# Patient Record
Sex: Male | Born: 1955 | Hispanic: No | Marital: Married | Smoking: Former smoker
Health system: Southern US, Community
[De-identification: ages and names within clinical notes are randomized; demographics above are authoritative.]

## PROBLEM LIST (undated history)

## (undated) DIAGNOSIS — M4317 Spondylolisthesis, lumbosacral region: Secondary | ICD-10-CM

## (undated) DIAGNOSIS — M5417 Radiculopathy, lumbosacral region: Secondary | ICD-10-CM

## (undated) DIAGNOSIS — G8929 Other chronic pain: Secondary | ICD-10-CM

## (undated) DIAGNOSIS — R519 Headache, unspecified: Secondary | ICD-10-CM

## (undated) DIAGNOSIS — R51 Headache: Secondary | ICD-10-CM

## (undated) DIAGNOSIS — I10 Essential (primary) hypertension: Secondary | ICD-10-CM

## (undated) HISTORY — PX: ABSCESS DRAINAGE: SHX1119

## (undated) HISTORY — DX: Other chronic pain: G89.29

---

## 1998-12-31 ENCOUNTER — Emergency Department (HOSPITAL_COMMUNITY): Admission: EM | Admit: 1998-12-31 | Discharge: 1998-12-31 | Payer: Self-pay | Admitting: Emergency Medicine

## 1998-12-31 ENCOUNTER — Encounter: Payer: Self-pay | Admitting: Emergency Medicine

## 2005-05-25 ENCOUNTER — Emergency Department (HOSPITAL_COMMUNITY): Admission: EM | Admit: 2005-05-25 | Discharge: 2005-05-25 | Payer: Self-pay | Admitting: Emergency Medicine

## 2005-05-29 ENCOUNTER — Other Ambulatory Visit: Admission: RE | Admit: 2005-05-29 | Discharge: 2005-05-29 | Payer: Self-pay | Admitting: Otolaryngology

## 2010-04-22 DIAGNOSIS — M5417 Radiculopathy, lumbosacral region: Secondary | ICD-10-CM

## 2010-04-22 HISTORY — DX: Radiculopathy, lumbosacral region: M54.17

## 2015-10-02 ENCOUNTER — Emergency Department (HOSPITAL_COMMUNITY)
Admission: EM | Admit: 2015-10-02 | Discharge: 2015-10-02 | Disposition: A | Discharge status: Home / Self Care | Payer: Self-pay | Attending: Emergency Medicine | Admitting: Emergency Medicine

## 2015-10-02 ENCOUNTER — Encounter (HOSPITAL_COMMUNITY): Payer: Self-pay | Admitting: Nurse Practitioner

## 2015-10-02 DIAGNOSIS — L918 Other hypertrophic disorders of the skin: Secondary | ICD-10-CM

## 2015-10-02 DIAGNOSIS — M545 Low back pain, unspecified: Secondary | ICD-10-CM

## 2015-10-02 DIAGNOSIS — Z87891 Personal history of nicotine dependence: Secondary | ICD-10-CM | POA: Insufficient documentation

## 2015-10-02 DIAGNOSIS — L723 Sebaceous cyst: Secondary | ICD-10-CM

## 2015-10-02 DIAGNOSIS — G8929 Other chronic pain: Secondary | ICD-10-CM | POA: Insufficient documentation

## 2015-10-02 DIAGNOSIS — I10 Essential (primary) hypertension: Secondary | ICD-10-CM | POA: Insufficient documentation

## 2015-10-02 HISTORY — DX: Essential (primary) hypertension: I10

## 2015-10-02 HISTORY — DX: Radiculopathy, lumbosacral region: M54.17

## 2015-10-02 MED ORDER — LIDOCAINE-EPINEPHRINE 1 %-1:100000 IJ SOLN
10.0000 mL | Freq: Once | INTRAMUSCULAR | Status: AC
  Administered 2015-10-02: 10 mL
  Filled 2015-10-02: qty 1

## 2015-10-02 MED ORDER — OXYCODONE-ACETAMINOPHEN 5-325 MG PO TABS: 1.0000 | ORAL_TABLET | Freq: Four times a day (QID) | ORAL | Status: DC | PRN

## 2015-10-02 MED ORDER — SULFAMETHOXAZOLE-TRIMETHOPRIM 800-160 MG PO TABS: 1.0000 | ORAL_TABLET | Freq: Two times a day (BID) | ORAL | Status: AC

## 2015-10-02 MED ORDER — MELOXICAM 15 MG PO TABS: 15.0000 mg | ORAL_TABLET | Freq: Every day | ORAL | Status: DC | PRN

## 2015-10-02 MED ORDER — SULFAMETHOXAZOLE-TRIMETHOPRIM 800-160 MG PO TABS
1.0000 | ORAL_TABLET | Freq: Once | ORAL | Status: AC
  Administered 2015-10-02: 1 via ORAL
  Filled 2015-10-02: qty 1

## 2015-10-02 NOTE — Progress Notes (Signed)
CM spoke with pt who confirms uninsured [place] resident with no pcp.  CM discussed and provided written information to assist pt with determining choice for uninsured accepting pcps, discussed the importance of pcp vs EDP services for f/u care, www.needymeds.org, www.goodrx.com, discounted pharmacies and other [place] resources such as [facility] , [facility], affordable care act, financial assistance, uninsured dental services, [facility], DSS and  health department  Reviewed resources for [place] uninsured accepting pcps like [facility], [facility] [address], [facility], [facility], local urgent care centers, [facility], [facility], general medical clinics, [facility], [facility] plus others, medication resources, [facility] out patient [facility]ies and housing Pt voiced understanding and appreciation of resources provided   Provided [facility] contact information 

## 2015-10-02 NOTE — ED Notes (Signed)
Patient presents to the [facility] today for complaints of a cyst on his neck that has grown larger within the past week.   He also complains of pain in his legs which he states is chronic and he receives disability for this condition. He has not gone to a doctor within the past year because he no longer has insurance. Previously he was taking gabapentin and tramadol which he said made him dizzy and did not help his pain. He also had epidural injections which only lasted 1-2 weeks. Surgery was mentioned to him but says his neurologist said he was not a good candidate. Patient is unsure why.   He was also prescribed lisinopril/hctz combo pill for blood pressure and has not taken that medication for approximately 1 year.   Patient also complains of some urinary urgency and low volume. He says he will have the urge to urinate and then very small amounts will come out. He is unsure of his prostate health. He does not think he has had a prostate exam and does not take any prostate medications. He denies other infection symptoms including fever, chills, or burning.

## 2015-10-02 NOTE — Discharge Instructions (Signed)
Back Pain, Adult Back pain is very common. The pain often gets better over time. The cause of back pain is usually not dangerous. Most people can learn to manage their back pain on their own.  HOME CARE  Watch your back pain for any changes. The following actions may help to lessen any pain you are feeling:  Stay active. Start with short walks on flat ground if you can. Try to walk farther each day.  Exercise regularly as told by your doctor. Exercise helps your back heal faster. It also helps avoid future injury by keeping your muscles strong and flexible.  Do not sit, drive, or stand in one place for more than 30 minutes.  Do not stay in bed. Resting more than 1-2 days can slow down your recovery.  Be careful when you bend or lift an object. Use good form when lifting:  Bend at your knees.  Keep the object close to your body.  Do not twist.  Sleep on a firm mattress. Lie on your side, and bend your knees. If you lie on your back, put a pillow under your knees.  Take medicines only as told by your doctor.  Put ice on the injured area.  Put ice in a plastic bag.  Place a towel between your skin and the bag.  Leave the ice on for 20 minutes, 2-3 times a day for the first 2-3 days. After that, you can switch between ice and heat packs.  Avoid feeling anxious or stressed. Find good ways to deal with stress, such as exercise.  Maintain a healthy weight. Extra weight puts stress on your back. GET HELP IF:   You have pain that does not go away with rest or medicine.  You have worsening pain that goes down into your legs or buttocks.  You have pain that does not get better in one week.  You have pain at night.  You lose weight.  You have a fever or chills. GET HELP RIGHT AWAY IF:   You cannot control when you poop (bowel movement) or pee (urinate).  Your arms or legs feel weak.  Your arms or legs lose feeling (numbness).  You feel sick to your stomach (nauseous) or  throw up (vomit).  You have belly (abdominal) pain.  You feel like you may pass out (faint).   This information is not intended to replace advice given to you by your health care provider. Make sure you discuss any questions you have with your health care provider.   Document Released: 09/25/2007 Document Revised: 04/29/2014 Document Reviewed: 08/10/2013 Elsevier Interactive Patient Education 2016 [employer].  Excision of Skin Lesions Excision of a skin lesion refers to the removal of a section of skin by making small cuts (incisions) in the skin. This procedure may be done to remove a cancerous (malignant) or noncancerous (benign) growth on the skin. It is typically done to treat or prevent cancer or infection. It may also be done to improve cosmetic appearance. The procedure may be done to remove:  Cancerous growths, such as basal cell carcinoma, squamous cell carcinoma, or melanoma.  Noncancerous growths, such as a cyst or lipoma.  Growths, such as moles or skin tags, which may be removed for cosmetic reasons. Various excision or surgical techniques may be used depending on your condition, the location of the lesion, and your overall health. LET [facility] CARE PROVIDER KNOW ABOUT:  Any allergies you have.  All medicines you are taking, including vitamins,  herbs, eye drops, creams, and over-the-counter medicines.  Previous problems you or members of your family have had with the use of anesthetics.  Any blood disorders you have.  Previous surgeries you have had.  Any medical conditions you have.  Whether you are pregnant or may be pregnant. RISKS AND COMPLICATIONS Generally, this is a safe procedure. However, problems may occur, including:  Bleeding.  Infection.  Scarring.  Recurrence of the cyst, lipoma, or cancer.  Changes in skin sensation or appearance, such as discoloration or swelling.  Reaction to the anesthetics.  Allergic reaction to surgical  materials or ointments.  Damage to nerves, blood vessels, muscles, or other structures.  Continued pain. BEFORE THE PROCEDURE  Ask your health care provider about:  Changing or stopping your regular medicines. This is especially important if you are taking diabetes medicines or blood thinners.  Taking medicines such as aspirin and ibuprofen. These medicines can thin your blood. Do not take these medicines before your procedure if your health care provider instructs you not to.  You may be asked to take certain medicines.  You may be asked to stop smoking.  You may have an exam or testing.  Plan to have someone take you home after the procedure.  Plan to have someone help you with activities during recovery. PROCEDURE  To reduce your risk of infection:  Your health care team will wash or sanitize their hands.  Your skin will be washed with soap.  You will be given a medicine to numb the area (local anesthetic).  One of the following excision techniques will be performed.  At the end of any of these procedures, antibiotic ointment will be applied as needed. Each of the following techniques may vary among health care providers and hospitals. Complete Surgical Excision The area of skin that needs to be removed will be marked with a pen. Using a small scalpel or scissors, the surgeon will gently cut around and under the lesion until it is completely removed. The lesion will be placed in a fluid and sent to the lab for examination. If necessary, bleeding will be controlled with a device that delivers heat (electrocautery). The edges of the wound may be stitched (sutured) together, and a bandage (dressing) will be applied. This procedure may be performed to treat a cancerous growth or a noncancerous cyst or lesion. Excision of a Cyst The surgeon will make an incision on the cyst. The entire cyst will be removed through the incision. The incision may be closed with sutures. Shave  Excision During shave excision, the surgeon will use a small blade or an electrically heated loop instrument to shave off the lesion. This may be done to remove a mole or a skin tag. The wound will usually be left to heal on its own without sutures. Punch Excision During punch excision, the surgeon will use a small tool that is like a cookie cutter or a hole punch to cut a circle shape out of the skin. The outer edges of the skin will be sutured together. This may be done to remove a mole or a scar or to perform a biopsy of the lesion. Mohs Micrographic Surgery During Mohs micrographic surgery, layers of the lesion will be removed with a scalpel or a loop instrument and will be examined right away under a microscope. Layers will be removed until all of the abnormal or cancerous tissue has been removed. This procedure is minimally invasive, and it ensures the best cosmetic outcome. It  involves the removal of as little normal tissue as possible. Mohs is usually done to treat skin cancer, such as basal cell carcinoma or squamous cell carcinoma, particularly on the face and ears. Depending on the size of the surgical wound, it may be sutured closed. AFTER THE PROCEDURE  Return to your normal activities as told by your health care provider.  Talk with your health care provider to discuss any test results, treatment options, and if necessary, the need for more tests.   This information is not intended to replace advice given to you by your health care provider. Make sure you discuss any questions you have with your health care provider.   Document Released: 07/03/2009 Document Revised: 12/28/2014 Document Reviewed: 05/25/2014 Elsevier Interactive Patient Education 2016 [place] [employer].  Epidermal Cyst An epidermal cyst is sometimes called a sebaceous cyst, epidermal inclusion cyst, or infundibular cyst. These cysts usually contain a substance that looks "pasty" or "cheesy" and may have a bad smell. This  substance is a protein called keratin. Epidermal cysts are usually found on the face, neck, or trunk. They may also occur in the vaginal area or other parts of the genitalia of both men and women. Epidermal cysts are usually small, painless, slow-growing bumps or lumps that move freely under the skin. It is important not to try to pop them. This may cause an infection and lead to tenderness and swelling. CAUSES  Epidermal cysts may be caused by a deep penetrating injury to the skin or a plugged hair follicle, often associated with acne. SYMPTOMS  Epidermal cysts can become inflamed and cause:  Redness.  Tenderness.  Increased temperature of the skin over the bumps or lumps.  Grayish-white, bad smelling material that drains from the bump or lump. DIAGNOSIS  Epidermal cysts are easily diagnosed by your caregiver during an exam. Rarely, a tissue sample (biopsy) may be taken to rule out other conditions that may resemble epidermal cysts. TREATMENT   Epidermal cysts often get better and disappear on their own. They are rarely ever cancerous.  If a cyst becomes infected, it may become inflamed and tender. This may require opening and draining the cyst. Treatment with antibiotics may be necessary. When the infection is gone, the cyst may be removed with minor surgery.  Small, inflamed cysts can often be treated with antibiotics or by injecting steroid medicines.  Sometimes, epidermal cysts become large and bothersome. If this happens, surgical removal in your caregiver's office may be necessary. HOME CARE INSTRUCTIONS  Only take over-the-counter or prescription medicines as directed by your caregiver.  Take your antibiotics as directed. Finish them even if you start to feel better. SEEK MEDICAL CARE IF:   Your cyst becomes tender, red, or swollen.  Your condition is not improving or is getting worse.  You have any other questions or concerns. MAKE SURE YOU:  Understand these  instructions.  Will watch your condition.  Will get help right away if you are not doing well or get worse.   This information is not intended to replace advice given to you by your health care provider. Make sure you discuss any questions you have with your health care provider.   Document Released: 03/09/2004 Document Revised: 07/01/2011 Document Reviewed: 10/15/2010 Elsevier Interactive Patient Education [employer].

## 2015-10-18 NOTE — ED Provider Notes (Signed)
CSN: [id number]     Arrival date & time 10/02/15  0913 History   First MD Initiated Contact with Patient 10/02/15 1027     Chief Complaint  Patient presents with  . Cyst     (Consider location/radiation/quality/duration/timing/severity/associated sxs/prior Treatment) HPI   60 year old male with lower back pain. This has been chronic for years. It interferes with his ability to ambulate into more than light activities. Is been worse for the last 2 days. Denies any acute trauma. No fevers or chills. No acute numbness, tingling or focal loss of strength. No urinary complaints. Denies use of blood thinning medication. He is also complaining of a sebaceous cyst to his right posterior/lateral neck. This has been present for years and has slowly been increasing in size. He also has several skin tags to his posterior neck and inquiring about possible removal.  Past Medical History  Diagnosis Date  . Radiculopathy, lumbosacral region 2012  . Hypertension    History reviewed. No pertinent past surgical history. History reviewed. No pertinent family history. Social History  Substance Use Topics  . Smoking status: Former Research scientist (life sciences)  . Smokeless tobacco: None  . Alcohol Use: No    Review of Systems  All systems reviewed and negative, other than as noted in HPI.   Allergies  Review of patient's allergies indicates no known allergies.  Home Medications   Prior to Admission medications   Medication Sig Start Date End Date Taking? Authorizing Provider  naproxen sodium (ANAPROX) 220 MG tablet Take 440 mg by mouth every 12 (twelve) hours as needed (for pain).   Yes Historical Provider, MD  meloxicam (MOBIC) 15 MG tablet Take 1 tablet (15 mg total) by mouth daily as needed for pain. 10/02/15   Virgel Manifold, MD  oxyCODONE-acetaminophen (PERCOCET/ROXICET) 5-325 MG tablet Take 1 tablet by mouth every 6 (six) hours as needed for severe pain. 10/02/15   Virgel Manifold, MD   BP 145/106 mmHg  Pulse 77   Temp(Src) 97.7 F (36.5 C) (Oral)  Resp 18  SpO2 99% Physical Exam  Constitutional: He appears well-developed and well-nourished. No distress.  HENT:  Head: Normocephalic and atraumatic.  Sebaceous cyst to right posterior lateral neck. Slightly larger than the size of a golf ball. Does not appear infected. Several small skin tags in the same general area.  Eyes: Conjunctivae are normal. Right eye exhibits no discharge. Left eye exhibits no discharge.  Neck: Neck supple.  Cardiovascular: Normal rate, regular rhythm and normal heart sounds.  Exam reveals no gallop and no friction rub.   No murmur heard. Pulmonary/Chest: Effort normal and breath sounds normal. No respiratory distress.  Abdominal: Soft. He exhibits no distension. There is no tenderness.  Musculoskeletal: He exhibits no edema or tenderness.  No midline spinal tenderness. Strength is 5 out of 5 bilateral lower extremities. Sensation is intact to light touch. Palpable DP pulses.  Neurological: He is alert.  Skin: Skin is warm and dry.  Psychiatric: He has a normal mood and affect. His behavior is normal. Thought content normal.  Nursing note and vitals reviewed.   ED Course  Procedures (including critical care time)   Epidermal cyst - minimal excision After obtaining informed consent, the patient's identity, procedure, and site were verified. The area was prepared with Chlorhexidine and draped.  % Xylocaine with Epinephrine local anaesthetic was used for anesthesia.  The cyst was incised with a #11 blade and the contents evacuated with pressure.  Hemostasis achieved with pressure without difficulty. Bacitracin and bandaid  applied.  Tolerated well. Negligible blood loss.  Routine instructions were given to the patient to return if any signs of infection.  Skin tag - shave excision Skin around skin tag was prepped with chlorhexidine, draped with sterile towels, infiltrated w/1% lidocaine with epinephrine ml to raise a  wheal, and shave excised.  Hemostasis achieved with direct pressure. Bacitracin and bandaid applied.  Tolerated well. Negligible blood loss. Skin tag discarded.  Labs Review Labs Reviewed - No data to display  Imaging Review No results found. I have personally reviewed and evaluated these images and lab results as part of my medical decision-making.   EKG Interpretation None      MDM   Final diagnoses:  Chronic lower back pain  Sebaceous cyst  Cutaneous skin tags    60 year old male with multiple complaints. His chief complaint is lower back pain which is chronic in nature. No concerning "red flags." Plan symptomatic treatment. He is additionally complaining of epidermal cyst to his right neck and systems contacts which were removed. Continued wound care return precautions were discussed. Outpatient follow-up otherwise.    Virgel Manifold, MD 10/18/15 [phone]

## 2015-10-24 ENCOUNTER — Emergency Department (HOSPITAL_COMMUNITY)
Admission: EM | Admit: 2015-10-24 | Discharge: 2015-10-24 | Disposition: A | Discharge status: Home / Self Care | Payer: Medicaid Other | Attending: Emergency Medicine | Admitting: Emergency Medicine

## 2015-10-24 ENCOUNTER — Encounter (HOSPITAL_COMMUNITY): Payer: Self-pay | Admitting: Emergency Medicine

## 2015-10-24 DIAGNOSIS — L02414 Cutaneous abscess of left upper limb: Secondary | ICD-10-CM | POA: Insufficient documentation

## 2015-10-24 DIAGNOSIS — I10 Essential (primary) hypertension: Secondary | ICD-10-CM | POA: Insufficient documentation

## 2015-10-24 DIAGNOSIS — Z87891 Personal history of nicotine dependence: Secondary | ICD-10-CM | POA: Insufficient documentation

## 2015-10-24 DIAGNOSIS — L0291 Cutaneous abscess, unspecified: Secondary | ICD-10-CM

## 2015-10-24 MED ORDER — LIDOCAINE-EPINEPHRINE 2 %-1:100000 IJ SOLN
10.0000 mL | Freq: Once | INTRAMUSCULAR | Status: AC
  Administered 2015-10-24: 10 mL
  Filled 2015-10-24: qty 1

## 2015-10-24 MED ORDER — CEPHALEXIN 500 MG PO CAPS: 500.0000 mg | ORAL_CAPSULE | Freq: Four times a day (QID) | ORAL | Status: DC

## 2015-10-24 NOTE — Discharge Instructions (Signed)
Abscess °An abscess is an infected area that contains a collection of pus and debris. It can occur in almost any part of the body. An abscess is also known as a furuncle or boil. °CAUSES  °An abscess occurs when tissue gets infected. This can occur from blockage of oil or sweat glands, infection of hair follicles, or a minor injury to the skin. As the body tries to fight the infection, pus collects in the area and creates pressure under the skin. This pressure causes pain. People with weakened immune systems have difficulty fighting infections and get certain abscesses more often.  °SYMPTOMS °Usually an abscess develops on the skin and becomes a painful mass that is red, warm, and tender. If the abscess forms under the skin, you may feel a moveable soft area under the skin. Some abscesses break open (rupture) on their own, but most will continue to get worse without care. The infection can spread deeper into the body and eventually into the bloodstream, causing you to feel ill.  °DIAGNOSIS  °Your caregiver will take your medical history and perform a physical exam. A sample of fluid may also be taken from the abscess to determine what is causing your infection. °TREATMENT  °Your caregiver may prescribe antibiotic medicines to fight the infection. However, taking antibiotics alone usually does not cure an abscess. Your caregiver may need to make a small cut (incision) in the abscess to drain the pus. In some cases, gauze is packed into the abscess to reduce pain and to continue draining the area. °HOME CARE INSTRUCTIONS  °· Only take over-the-counter or prescription medicines for pain, discomfort, or fever as directed by your caregiver. °· If you were prescribed antibiotics, take them as directed. Finish them even if you start to feel better. °· If gauze is used, follow your caregiver's directions for changing the gauze. °· To avoid spreading the infection: °· Keep your draining abscess covered with a  bandage. °· Wash your hands well. °· Do not share personal care items, towels, or whirlpools with others. °· Avoid skin contact with others. °· Keep your skin and clothes clean around the abscess. °· Keep all follow-up appointments as directed by your caregiver. °SEEK MEDICAL CARE IF:  °· You have increased pain, swelling, redness, fluid drainage, or bleeding. °· You have muscle aches, chills, or a general ill feeling. °· You have a fever. °MAKE SURE YOU:  °· Understand these instructions. °· Will watch your condition. °· Will get help right away if you are not doing well or get worse. °  °This information is not intended to replace advice given to you by your health care provider. Make sure you discuss any questions you have with your health care provider. °  °Document Released: 01/16/2005 Document Revised: 10/08/2011 Document Reviewed: 06/21/2011 °Elsevier Interactive Patient Education ©2016 Elsevier Inc. ° °Incision and Drainage °Incision and drainage is a procedure in which a sac-like structure (cystic structure) is opened and drained. The area to be drained usually contains material such as pus, fluid, or blood.  °LET YOUR CAREGIVER KNOW ABOUT:  °· Allergies to medicine. °· Medicines taken, including vitamins, herbs, eyedrops, over-the-counter medicines, and creams. °· Use of steroids (by mouth or creams). °· Previous problems with anesthetics or numbing medicines. °· History of bleeding problems or blood clots. °· Previous surgery. °· Other health problems, including diabetes and kidney problems. °· Possibility of pregnancy, if this applies. °RISKS AND COMPLICATIONS °· Pain. °· Bleeding. °· Scarring. °· Infection. °BEFORE THE PROCEDURE  °  You may need to have an ultrasound or other imaging tests to see how large or deep your cystic structure is. Blood tests may also be used to determine if you have an infection or how severe the infection is. You may need to have a tetanus shot. PROCEDURE  The affected area  is cleaned with a cleaning fluid. The cyst area will then be numbed with a medicine (local anesthetic). A small incision will be made in the cystic structure. A syringe or catheter may be used to drain the contents of the cystic structure, or the contents may be squeezed out. The area will then be flushed with a cleansing solution. After cleansing the area, it is often gently packed with a gauze or another wound dressing. Once it is packed, it will be covered with gauze and tape or some other type of wound dressing. AFTER THE PROCEDURE   Often, you will be allowed to go home right after the procedure.  You may be given antibiotic medicine to prevent or heal an infection.  If the area was packed with gauze or some other wound dressing, you will likely need to come back in 1 to 2 days to get it removed.  The area should heal in about 14 days.   This information is not intended to replace advice given to you by your health care provider. Make sure you discuss any questions you have with your health care provider.  Remove approximately 1/2inch of packing daily until all packing is removed. Take antibiotics as prescribed. Your cyst may return, unfortunately. If this happens please follow up with [facility] for further evaluation. Return to the ED if you experience redness or swelling around your wound, fevers, chills.

## 2015-10-24 NOTE — ED Notes (Addendum)
Patient reports abscess to left shoulder x2 weeks. States he tried to bust the abscess yesterday and fluid came out. Denies fever and pain. Reports itchiness to area.

## 2015-10-24 NOTE — ED Notes (Signed)
Discharge instructions, follow up care, and rx x1 reviewed with patient. Patient verbalized understanding. 

## 2015-10-24 NOTE — ED Provider Notes (Signed)
CSN: [id number]     Arrival date & time 10/24/15  [id number] History   First MD Initiated Contact with Patient 10/24/15 1020     Chief Complaint  Patient presents with  . Abscess     (Consider location/radiation/quality/duration/timing/severity/associated sxs/prior Treatment) HPI   Steven Ross is a 60 year old male with past medical history of HTN, back pain who presents the ED today complaining of a cyst to his left shoulder. Patient states that this is isn't present for greater than 10 years. He was seen in the ED approximately one month ago and had the cyst excised. He was given antibiotics which she has taken all of them. Patient states that his symptoms had improved for a while. However, yesterday the cyst got larger and began draining clear/brown fluid that has a foul odor to it. He states that the wound has not healed completely. He states that it is not painful at all. He has been pressing on it to express the fluids. He denies any fevers, chills, redness around the wound.  Past Medical History  Diagnosis Date  . Radiculopathy, lumbosacral region 2012  . Hypertension    History reviewed. No pertinent past surgical history. No family history on file. Social History  Substance Use Topics  . Smoking status: Former Research scientist (life sciences)  . Smokeless tobacco: None  . Alcohol Use: No    Review of Systems  All other systems reviewed and are negative.     Allergies  Review of patient's allergies indicates no known allergies.  Home Medications   Prior to Admission medications   Medication Sig Start Date End Date Taking? Authorizing Provider  meloxicam (MOBIC) 15 MG tablet Take 1 tablet (15 mg total) by mouth daily as needed for pain. 10/02/15   Virgel Manifold, MD  naproxen sodium (ANAPROX) 220 MG tablet Take 440 mg by mouth every 12 (twelve) hours as needed (for pain).    Historical Provider, MD  oxyCODONE-acetaminophen (PERCOCET/ROXICET) 5-325 MG tablet Take 1 tablet by mouth every 6 (six) hours  as needed for severe pain. 10/02/15   Virgel Manifold, MD   BP 156/98 mmHg  Pulse 89  Temp(Src) 97.8 F (36.6 C) (Oral)  Resp 16  Ht 5\' 4"  (1.626 m)  Wt 85.73 kg  BMI 32.43 kg/m2  SpO2 99% Physical Exam  Constitutional: He is oriented to person, place, and time. He appears well-developed and well-nourished. No distress.  HENT:  Head: Normocephalic and atraumatic.  Eyes: Conjunctivae are normal. Right eye exhibits no discharge. Left eye exhibits no discharge. No scleral icterus.  Neck: Neck supple.  Cardiovascular: Normal rate.   Pulmonary/Chest: Effort normal.  Neurological: He is alert and oriented to person, place, and time. Coordination normal.  Skin: Skin is warm and dry. No rash noted. He is not diaphoretic. No erythema. No pallor.     1cmx2cm sebaceous cyst over left shoulder. Overlying erythema presents. No induration or streaking. NonTTP.   Psychiatric: He has a normal mood and affect. His behavior is normal.  Nursing note and vitals reviewed.   ED Course  Procedures (including critical care time)  INCISION AND DRAINAGE Performed by: Steven Ross Consent: Verbal consent obtained. Risks and benefits: risks, benefits and alternatives were discussed Type: abscess  Body area: left shoulder  Anesthesia: local infiltration  Incision was made with a scalpel.  Local anesthetic: lidocaine 1% without epinephrine  Anesthetic total: 5 ml  Complexity: complex Blunt dissection to break up loculations  Drainage: purulent  Drainage amount: copious  Packing material:  1/4 in iodoform gauze  Patient tolerance: Patient tolerated the procedure well with no immediate complications.    Labs Review Labs Reviewed - No data to display  Imaging Review No results found. I have personally reviewed and evaluated these images and lab results as part of my medical decision-making.   EKG Interpretation None      MDM   Final diagnoses:  Abscess    Patient  with sebaceous cyst of skin with overlying erythema. Previous incision mark presnt from previous I&D. Small amount of serosanguinous fluid draining out. Abscess amenable to incision and drainage. Large amount of cyst contents removed. Wound was packed and recommend recheck in 2 days. Encouraged home warm soaks and flushing.  Mild signs of cellulitis is surrounding skin.  Will d/c to home. Pt given Rx for keflex given this cyst has returned despite previous I & D less than one month ago. Will also give referral to [place] surgery if cyst return again.    Dondra Spry Kimball, PA-C 10/24/15 1246  Gareth Morgan, MD 10/24/15 2300

## 2016-11-28 DIAGNOSIS — M4727 Other spondylosis with radiculopathy, lumbosacral region: Secondary | ICD-10-CM | POA: Diagnosis not present

## 2016-11-28 DIAGNOSIS — I10 Essential (primary) hypertension: Secondary | ICD-10-CM | POA: Diagnosis not present

## 2016-11-28 DIAGNOSIS — M5416 Radiculopathy, lumbar region: Secondary | ICD-10-CM | POA: Diagnosis not present

## 2016-11-28 DIAGNOSIS — Z6833 Body mass index (BMI) 33.0-33.9, adult: Secondary | ICD-10-CM | POA: Diagnosis not present

## 2016-12-04 ENCOUNTER — Other Ambulatory Visit: Payer: Self-pay | Admitting: Neurological Surgery

## 2016-12-04 DIAGNOSIS — M545 Low back pain: Secondary | ICD-10-CM | POA: Diagnosis not present

## 2016-12-04 DIAGNOSIS — M4727 Other spondylosis with radiculopathy, lumbosacral region: Secondary | ICD-10-CM | POA: Diagnosis not present

## 2016-12-04 DIAGNOSIS — M4317 Spondylolisthesis, lumbosacral region: Secondary | ICD-10-CM | POA: Diagnosis not present

## 2016-12-04 DIAGNOSIS — Z6834 Body mass index (BMI) 34.0-34.9, adult: Secondary | ICD-10-CM | POA: Diagnosis not present

## 2016-12-04 DIAGNOSIS — M5416 Radiculopathy, lumbar region: Secondary | ICD-10-CM | POA: Diagnosis not present

## 2016-12-05 ENCOUNTER — Other Ambulatory Visit (HOSPITAL_COMMUNITY): Payer: Self-pay | Admitting: Neurological Surgery

## 2016-12-05 DIAGNOSIS — M4726 Other spondylosis with radiculopathy, lumbar region: Secondary | ICD-10-CM

## 2016-12-11 NOTE — Congregational Nurse Program (Signed)
Congregational Nurse Program Note  Date of Encounter: 12/11/2016  Past Medical History: Past Medical History:  Diagnosis Date  . Hypertension   . Radiculopathy, lumbosacral region 2012    Encounter Details: Patient came to CN office asking to make an appointment with a PCP.  He sees Dr. Cyndy Freeze at [facility] and is scheduled for back surgery on 12/27/2016.  States he is on Lisinopril and Gabapentin per Dr. Cyndy Freeze but does not have a PCP.  BP today 140/90 and says he took his medicine this am.  Monitors BP at home and it was 140/84 this morning.  States he has [employer].  CN called [facility] to schedule new patient appointment.  Staff member there checked Medicaid eligibility and patient has Family Planning Only Medicaid.  Patient receives  disability income but denies having Medicare.  He was referred to the [employer] and learned that he does have Medicare Part A and Part B.  Phone call later to CN stating he found his Medicare Card at home. CN will call Internal Medicine clinic back tomorrow to make new patient appointment.  Jake Michaelis RN, Congregational Nurse [phone]     CNP Questionnaire - 12/11/16 1937      Patient Demographics   Is this a new or existing patient? New   Patient is considered a/an Refugee   Race Asian     Patient Assistance   Location of Patient Assistance Not Applicable   Patient's financial/insurance status Medicare   Uninsured Patient ([facility]) No   Patient referred to apply for the following financial assistance Not Applicable   Food insecurities addressed Not Applicable   Transportation assistance No   Assistance securing medications No   Educational health offerings Hypertension;Navigating the healthcare system     Encounter Details   Primary purpose of visit Navigating the Healthcare System;Chronic Illness/Condition Visit   Was an Emergency Department visit averted? Not  Applicable   Does patient have a medical provider? No   Patient referred to Establish PCP   Was a mental health screening completed? (GAINS tool) No   Does patient have dental issues? No   Does patient have vision issues? No   Does your patient have an abnormal blood pressure today? Yes   Since previous encounter, have you referred patient for abnormal blood pressure that resulted in a new diagnosis or medication change? No   Does your patient have an abnormal blood glucose today? No   Since previous encounter, have you referred patient for abnormal blood glucose that resulted in a new diagnosis or medication change? No   Was there a life-saving intervention made? No

## 2016-12-20 ENCOUNTER — Ambulatory Visit (HOSPITAL_COMMUNITY)
Admission: RE | Admit: 2016-12-20 | Discharge: 2016-12-20 | Disposition: A | Discharge status: Home / Self Care | Payer: Medicare Other | Source: Ambulatory Visit | Attending: Neurological Surgery | Admitting: Neurological Surgery

## 2016-12-20 DIAGNOSIS — M4316 Spondylolisthesis, lumbar region: Secondary | ICD-10-CM | POA: Insufficient documentation

## 2016-12-20 DIAGNOSIS — M4726 Other spondylosis with radiculopathy, lumbar region: Secondary | ICD-10-CM | POA: Insufficient documentation

## 2016-12-20 DIAGNOSIS — M47897 Other spondylosis, lumbosacral region: Secondary | ICD-10-CM | POA: Insufficient documentation

## 2016-12-20 DIAGNOSIS — M47896 Other spondylosis, lumbar region: Secondary | ICD-10-CM | POA: Insufficient documentation

## 2016-12-20 DIAGNOSIS — M48061 Spinal stenosis, lumbar region without neurogenic claudication: Secondary | ICD-10-CM | POA: Insufficient documentation

## 2016-12-20 DIAGNOSIS — M4807 Spinal stenosis, lumbosacral region: Secondary | ICD-10-CM | POA: Diagnosis not present

## 2016-12-26 ENCOUNTER — Encounter (HOSPITAL_COMMUNITY): Payer: Self-pay

## 2016-12-26 ENCOUNTER — Encounter (HOSPITAL_COMMUNITY): Payer: Self-pay | Admitting: Certified Registered Nurse Anesthetist

## 2016-12-26 ENCOUNTER — Encounter (HOSPITAL_COMMUNITY)
Admission: RE | Admit: 2016-12-26 | Discharge: 2016-12-26 | Disposition: A | Discharge status: Home / Self Care | Payer: Medicare Other | Source: Ambulatory Visit | Attending: Neurological Surgery | Admitting: Neurological Surgery

## 2016-12-26 DIAGNOSIS — M5417 Radiculopathy, lumbosacral region: Secondary | ICD-10-CM | POA: Diagnosis not present

## 2016-12-26 DIAGNOSIS — E669 Obesity, unspecified: Secondary | ICD-10-CM | POA: Diagnosis not present

## 2016-12-26 DIAGNOSIS — Z87891 Personal history of nicotine dependence: Secondary | ICD-10-CM | POA: Diagnosis not present

## 2016-12-26 DIAGNOSIS — I10 Essential (primary) hypertension: Secondary | ICD-10-CM | POA: Diagnosis not present

## 2016-12-26 DIAGNOSIS — M4317 Spondylolisthesis, lumbosacral region: Secondary | ICD-10-CM | POA: Diagnosis not present

## 2016-12-26 DIAGNOSIS — Z6833 Body mass index (BMI) 33.0-33.9, adult: Secondary | ICD-10-CM | POA: Diagnosis not present

## 2016-12-26 HISTORY — DX: Headache: R51

## 2016-12-26 HISTORY — DX: Headache, unspecified: R51.9

## 2016-12-26 HISTORY — DX: Spondylolisthesis, lumbosacral region: M43.17

## 2016-12-26 LAB — BASIC METABOLIC PANEL
ANION GAP: 5 (ref 5–15)
BUN: 16 mg/dL (ref 6–20)
CHLORIDE: 107 mmol/L (ref 101–111)
CO2: 28 mmol/L (ref 22–32)
CREATININE: 0.75 mg/dL (ref 0.61–1.24)
Calcium: 9 mg/dL (ref 8.9–10.3)
GFR calc non Af Amer: >60 mL/min (ref >60)
Glucose, Bld: 92 mg/dL (ref 65–99)
POTASSIUM: 4 mmol/L (ref 3.5–5.1)
SODIUM: 140 mmol/L (ref 135–145)

## 2016-12-26 LAB — CBC
HCT: 44.7 % (ref 39.0–52.0)
Hemoglobin: 14.8 g/dL (ref 13.0–17.0)
MCH: 29.7 pg (ref 26.0–34.0)
MCHC: 33.1 g/dL (ref 30.0–36.0)
MCV: 89.8 fL (ref 78.0–100.0)
PLATELETS: 182 10*3/uL (ref 150–400)
RBC: 4.98 MIL/uL (ref 4.22–5.81)
RDW: 13.6 % (ref 11.5–15.5)
WBC: 7.8 10*3/uL (ref 4.0–10.5)

## 2016-12-26 LAB — SURGICAL PCR SCREEN
MRSA, PCR: NEGATIVE
Staphylococcus aureus: NEGATIVE

## 2016-12-26 LAB — TYPE AND SCREEN
ABO/RH(D): A POS
Antibody Screen: NEGATIVE

## 2016-12-26 LAB — ABO/RH: ABO/RH(D): A POS

## 2016-12-26 NOTE — Progress Notes (Signed)
Pt denies SOB, chest pain, and being under the care of a cardiologist. Pt denies having a stress test, echo and cardiac cath. Pt denies having an EKG and chest x ray within the last year. Pt denies having recent labs.

## 2016-12-26 NOTE — Pre-Procedure Instructions (Signed)
    Steven Ross  12/26/2016      [id number] [address], [place] - [place] [address] Phone: [phone] Fax: [phone]    Your procedure is scheduled on Friday, December 27, 2016  Report to [facility] Admitting at 5:30 A.M.  Call this number if you have problems the morning of surgery:  [phone]   Remember:  Do not eat food or drink liquids after midnight.  Take these medicines the morning of surgery with A SIP OF WATER:gabapentin (NEURONTIN) Stop taking Aspirin, vitamins, fish oil and herbal medications. Do not take any NSAIDs ie: Ibuprofen, Advil, Naproxen (Anaprox),meloxicam (MOBIC), diclofenac (VOLTAREN) ,  Motrin, BC and Goody Powder  or any medication containing Aspirin;stop now.  Do not wear jewelry, make-up or nail polish.  Do not wear lotions, powders, or perfumes, or deoderant.  Do not shave 48 hours prior to surgery.  Men may shave face and neck.  Do not bring valuables to the hospital.  [facility] is not responsible for any belongings or valuables.  Contacts, dentures or bridgework may not be worn into surgery.  Leave your suitcase in the car.  After surgery it may be brought to your room. For patients admitted to the hospital, discharge time will be determined by your treatment team. Special instructions: Shower the night before surgery and the morning of surgery with CHG. Please read over the following fact sheets that you were given. Pain Booklet, Coughing and Deep Breathing, Blood Transfusion Information, MRSA Information and Surgical Site Infection Prevention

## 2016-12-27 ENCOUNTER — Inpatient Hospital Stay (HOSPITAL_COMMUNITY): Payer: Medicare Other

## 2016-12-27 ENCOUNTER — Encounter (HOSPITAL_COMMUNITY): Payer: Self-pay | Admitting: Urology

## 2016-12-27 ENCOUNTER — Inpatient Hospital Stay (HOSPITAL_COMMUNITY)
Admission: RE | Disposition: A | Discharge status: Home / Self Care | Payer: Self-pay | Source: Ambulatory Visit | Attending: Neurological Surgery

## 2016-12-27 ENCOUNTER — Inpatient Hospital Stay (HOSPITAL_COMMUNITY)
Admission: RE | Admit: 2016-12-27 | Discharge: 2016-12-30 | DRG: 460 | Disposition: A | Discharge status: Home / Self Care | Payer: Medicare Other | Source: Ambulatory Visit | Attending: Neurological Surgery | Admitting: Neurological Surgery

## 2016-12-27 ENCOUNTER — Inpatient Hospital Stay (HOSPITAL_COMMUNITY): Payer: Medicare Other | Admitting: Certified Registered Nurse Anesthetist

## 2016-12-27 DIAGNOSIS — M549 Dorsalgia, unspecified: Secondary | ICD-10-CM | POA: Diagnosis not present

## 2016-12-27 DIAGNOSIS — M5417 Radiculopathy, lumbosacral region: Secondary | ICD-10-CM | POA: Diagnosis present

## 2016-12-27 DIAGNOSIS — E669 Obesity, unspecified: Secondary | ICD-10-CM | POA: Diagnosis not present

## 2016-12-27 DIAGNOSIS — M4317 Spondylolisthesis, lumbosacral region: Secondary | ICD-10-CM | POA: Diagnosis present

## 2016-12-27 DIAGNOSIS — Z6833 Body mass index (BMI) 33.0-33.9, adult: Secondary | ICD-10-CM | POA: Diagnosis not present

## 2016-12-27 DIAGNOSIS — I10 Essential (primary) hypertension: Secondary | ICD-10-CM | POA: Diagnosis not present

## 2016-12-27 DIAGNOSIS — Z87891 Personal history of nicotine dependence: Secondary | ICD-10-CM

## 2016-12-27 DIAGNOSIS — Z419 Encounter for procedure for purposes other than remedying health state, unspecified: Secondary | ICD-10-CM

## 2016-12-27 DIAGNOSIS — M4326 Fusion of spine, lumbar region: Secondary | ICD-10-CM | POA: Diagnosis not present

## 2016-12-27 DIAGNOSIS — M4316 Spondylolisthesis, lumbar region: Secondary | ICD-10-CM | POA: Diagnosis not present

## 2016-12-27 DIAGNOSIS — M4727 Other spondylosis with radiculopathy, lumbosacral region: Secondary | ICD-10-CM | POA: Diagnosis not present

## 2016-12-27 HISTORY — PX: ANTERIOR LAT LUMBAR FUSION: SHX1168

## 2016-12-27 HISTORY — PX: APPLICATION OF ROBOTIC ASSISTANCE FOR SPINAL PROCEDURE: SHX6753

## 2016-12-27 SURGERY — ANTERIOR LATERAL LUMBAR FUSION 1 LEVEL
Anesthesia: General | Site: Back

## 2016-12-27 MED ORDER — SODIUM CHLORIDE 0.9 % IJ SOLN
INTRAMUSCULAR | Status: DC | PRN
  Administered 2016-12-27 (×2): 10 mL

## 2016-12-27 MED ORDER — THROMBIN 20000 UNITS EX SOLR
CUTANEOUS | Status: DC | PRN
  Administered 2016-12-27: 20 mL via TOPICAL

## 2016-12-27 MED ORDER — ROCURONIUM BROMIDE 100 MG/10ML IV SOLN
INTRAVENOUS | Status: DC | PRN
  Administered 2016-12-27: 40 mg via INTRAVENOUS
  Administered 2016-12-27: 10 mg via INTRAVENOUS
  Administered 2016-12-27: 40 mg via INTRAVENOUS

## 2016-12-27 MED ORDER — FENTANYL CITRATE (PF) 250 MCG/5ML IJ SOLN
INTRAMUSCULAR | Status: AC
  Filled 2016-12-27: qty 5

## 2016-12-27 MED ORDER — ZOLPIDEM TARTRATE 5 MG PO TABS: 5.0000 mg | ORAL_TABLET | Freq: Every evening | ORAL | Status: DC | PRN

## 2016-12-27 MED ORDER — ALBUMIN HUMAN 5 % IV SOLN
INTRAVENOUS | Status: AC
  Filled 2016-12-27: qty 250

## 2016-12-27 MED ORDER — BUPIVACAINE LIPOSOME 1.3 % IJ SUSP
20.0000 mL | INTRAMUSCULAR | Status: DC
  Filled 2016-12-27: qty 20

## 2016-12-27 MED ORDER — SODIUM CHLORIDE 0.9 % IV SOLN
INTRAVENOUS | Status: DC
  Administered 2016-12-27 – 2016-12-28 (×2): via INTRAVENOUS

## 2016-12-27 MED ORDER — MENTHOL 3 MG MT LOZG: 1.0000 | LOZENGE | OROMUCOSAL | Status: DC | PRN

## 2016-12-27 MED ORDER — MIDAZOLAM HCL 5 MG/5ML IJ SOLN
INTRAMUSCULAR | Status: DC | PRN
  Administered 2016-12-27: 2 mg via INTRAVENOUS
  Administered 2016-12-27: 1 mg via INTRAVENOUS

## 2016-12-27 MED ORDER — CHLORHEXIDINE GLUCONATE CLOTH 2 % EX PADS: 6.0000 | MEDICATED_PAD | Freq: Once | CUTANEOUS | Status: DC

## 2016-12-27 MED ORDER — OXYCODONE HCL 5 MG PO TABS
5.0000 mg | ORAL_TABLET | ORAL | Status: DC | PRN
  Administered 2016-12-27: 10 mg via ORAL
  Filled 2016-12-27: qty 2

## 2016-12-27 MED ORDER — BUPIVACAINE-EPINEPHRINE (PF) 0.5% -1:200000 IJ SOLN
INTRAMUSCULAR | Status: AC
  Filled 2016-12-27: qty 30

## 2016-12-27 MED ORDER — BUPIVACAINE-EPINEPHRINE (PF) 0.5% -1:200000 IJ SOLN
INTRAMUSCULAR | Status: DC | PRN
  Administered 2016-12-27: 10 mL
  Administered 2016-12-27: 5 mL via PERINEURAL

## 2016-12-27 MED ORDER — FLEET ENEMA 7-19 GM/118ML RE ENEM: 1.0000 | ENEMA | Freq: Once | RECTAL | Status: DC | PRN

## 2016-12-27 MED ORDER — ONDANSETRON HCL 4 MG PO TABS: 4.0000 mg | ORAL_TABLET | Freq: Four times a day (QID) | ORAL | Status: DC | PRN

## 2016-12-27 MED ORDER — DEXAMETHASONE SODIUM PHOSPHATE 4 MG/ML IJ SOLN
INTRAMUSCULAR | Status: DC | PRN
  Administered 2016-12-27: 10 mg via INTRAVENOUS

## 2016-12-27 MED ORDER — SODIUM CHLORIDE 0.9 % IR SOLN
Status: DC | PRN
  Administered 2016-12-27: 500 mL

## 2016-12-27 MED ORDER — PHENYLEPHRINE HCL 10 MG/ML IJ SOLN
INTRAMUSCULAR | Status: DC | PRN
  Administered 2016-12-27: 80 ug via INTRAVENOUS

## 2016-12-27 MED ORDER — ACETAMINOPHEN 10 MG/ML IV SOLN
INTRAVENOUS | Status: DC | PRN
  Administered 2016-12-27: 1000 mg via INTRAVENOUS

## 2016-12-27 MED ORDER — THROMBIN 5000 UNITS EX SOLR
CUTANEOUS | Status: AC
  Filled 2016-12-27: qty 5000

## 2016-12-27 MED ORDER — SUGAMMADEX SODIUM 200 MG/2ML IV SOLN
INTRAVENOUS | Status: DC | PRN
  Administered 2016-12-27: 200 mg via INTRAVENOUS

## 2016-12-27 MED ORDER — PROPOFOL 10 MG/ML IV BOLUS
INTRAVENOUS | Status: DC | PRN
  Administered 2016-12-27: 150 mg via INTRAVENOUS

## 2016-12-27 MED ORDER — CEFAZOLIN SODIUM-DEXTROSE 1-4 GM/50ML-% IV SOLN
1.0000 g | Freq: Three times a day (TID) | INTRAVENOUS | Status: AC
  Administered 2016-12-27 – 2016-12-28 (×2): 1 g via INTRAVENOUS
  Filled 2016-12-27 (×2): qty 50

## 2016-12-27 MED ORDER — BUPIVACAINE LIPOSOME 1.3 % IJ SUSP
INTRAMUSCULAR | Status: DC | PRN
  Administered 2016-12-27: 20 mL

## 2016-12-27 MED ORDER — LIDOCAINE HCL (CARDIAC) 20 MG/ML IV SOLN
INTRAVENOUS | Status: DC | PRN
  Administered 2016-12-27: 60 mg via INTRAVENOUS

## 2016-12-27 MED ORDER — SODIUM CHLORIDE 0.9 % IV SOLN: 250.0000 mL | INTRAVENOUS | Status: DC

## 2016-12-27 MED ORDER — THROMBIN 5000 UNITS EX SOLR
OROMUCOSAL | Status: DC | PRN
  Administered 2016-12-27 (×3): 5 mL via TOPICAL

## 2016-12-27 MED ORDER — VANCOMYCIN HCL 1000 MG IV SOLR
INTRAVENOUS | Status: DC | PRN
  Administered 2016-12-27: 1000 mg

## 2016-12-27 MED ORDER — CEFAZOLIN SODIUM-DEXTROSE 2-4 GM/100ML-% IV SOLN
2.0000 g | INTRAVENOUS | Status: AC
  Administered 2016-12-27 (×2): 2 g via INTRAVENOUS
  Filled 2016-12-27: qty 100

## 2016-12-27 MED ORDER — DIAZEPAM 5 MG PO TABS: 5.0000 mg | ORAL_TABLET | Freq: Four times a day (QID) | ORAL | Status: DC | PRN

## 2016-12-27 MED ORDER — SENNA 8.6 MG PO TABS
1.0000 | ORAL_TABLET | Freq: Two times a day (BID) | ORAL | Status: DC
  Administered 2016-12-27 – 2016-12-30 (×6): 8.6 mg via ORAL
  Filled 2016-12-27 (×6): qty 1

## 2016-12-27 MED ORDER — BUPIVACAINE HCL (PF) 0.5 % IJ SOLN
INTRAMUSCULAR | Status: AC
  Filled 2016-12-27: qty 30

## 2016-12-27 MED ORDER — 0.9 % SODIUM CHLORIDE (POUR BTL) OPTIME
TOPICAL | Status: DC | PRN
  Administered 2016-12-27: 1000 mL

## 2016-12-27 MED ORDER — LISINOPRIL 10 MG PO TABS
10.0000 mg | ORAL_TABLET | Freq: Every day | ORAL | Status: DC
  Administered 2016-12-28 – 2016-12-30 (×3): 10 mg via ORAL
  Filled 2016-12-27 (×3): qty 1

## 2016-12-27 MED ORDER — LACTATED RINGERS IV SOLN
INTRAVENOUS | Status: DC | PRN
  Administered 2016-12-27: 07:00:00 via INTRAVENOUS

## 2016-12-27 MED ORDER — PANTOPRAZOLE SODIUM 40 MG IV SOLR
40.0000 mg | Freq: Every day | INTRAVENOUS | Status: DC
  Administered 2016-12-27: 40 mg via INTRAVENOUS
  Filled 2016-12-27: qty 40

## 2016-12-27 MED ORDER — MIDAZOLAM HCL 2 MG/2ML IJ SOLN
INTRAMUSCULAR | Status: AC
  Filled 2016-12-27: qty 2

## 2016-12-27 MED ORDER — METHYLPREDNISOLONE ACETATE 80 MG/ML IJ SUSP
INTRAMUSCULAR | Status: AC
  Filled 2016-12-27: qty 1

## 2016-12-27 MED ORDER — PHENOL 1.4 % MT LIQD: 1.0000 | OROMUCOSAL | Status: DC | PRN

## 2016-12-27 MED ORDER — LIDOCAINE-EPINEPHRINE (PF) 2 %-1:200000 IJ SOLN
INTRAMUSCULAR | Status: AC
  Filled 2016-12-27: qty 20

## 2016-12-27 MED ORDER — ALBUMIN HUMAN 5 % IV SOLN
12.5000 g | Freq: Once | INTRAVENOUS | Status: AC
  Administered 2016-12-27: 12.5 g via INTRAVENOUS

## 2016-12-27 MED ORDER — BISACODYL 5 MG PO TBEC: 5.0000 mg | DELAYED_RELEASE_TABLET | Freq: Every day | ORAL | Status: DC | PRN

## 2016-12-27 MED ORDER — CELECOXIB 200 MG PO CAPS
200.0000 mg | ORAL_CAPSULE | Freq: Two times a day (BID) | ORAL | Status: DC
  Administered 2016-12-27 – 2016-12-30 (×6): 200 mg via ORAL
  Filled 2016-12-27 (×6): qty 1

## 2016-12-27 MED ORDER — LACTATED RINGERS IV SOLN
INTRAVENOUS | Status: DC | PRN
  Administered 2016-12-27 (×2): via INTRAVENOUS

## 2016-12-27 MED ORDER — DEXMEDETOMIDINE HCL IN NACL 400 MCG/100ML IV SOLN
INTRAVENOUS | Status: DC | PRN
  Administered 2016-12-27: .3 ug/kg/h via INTRAVENOUS

## 2016-12-27 MED ORDER — LACTATED RINGERS IV SOLN
Freq: Once | INTRAVENOUS | Status: AC
  Administered 2016-12-27: 1000 mL via INTRAVENOUS

## 2016-12-27 MED ORDER — SUCCINYLCHOLINE CHLORIDE 20 MG/ML IJ SOLN
INTRAMUSCULAR | Status: DC | PRN
  Administered 2016-12-27: 100 mg via INTRAVENOUS

## 2016-12-27 MED ORDER — ONDANSETRON HCL 4 MG/2ML IJ SOLN
INTRAMUSCULAR | Status: DC | PRN
  Administered 2016-12-27: 4 mg via INTRAVENOUS

## 2016-12-27 MED ORDER — PROPOFOL 10 MG/ML IV BOLUS
INTRAVENOUS | Status: AC
  Filled 2016-12-27: qty 20

## 2016-12-27 MED ORDER — SODIUM CHLORIDE 0.9% FLUSH: 3.0000 mL | INTRAVENOUS | Status: DC | PRN

## 2016-12-27 MED ORDER — BUPIVACAINE HCL (PF) 0.5 % IJ SOLN
INTRAMUSCULAR | Status: DC | PRN
  Administered 2016-12-27: 2 mL

## 2016-12-27 MED ORDER — DOCUSATE SODIUM 100 MG PO CAPS
100.0000 mg | ORAL_CAPSULE | Freq: Two times a day (BID) | ORAL | Status: DC
  Administered 2016-12-27 – 2016-12-30 (×6): 100 mg via ORAL
  Filled 2016-12-27 (×6): qty 1

## 2016-12-27 MED ORDER — SODIUM CHLORIDE 0.9% FLUSH
3.0000 mL | Freq: Two times a day (BID) | INTRAVENOUS | Status: DC
  Administered 2016-12-28 – 2016-12-29 (×5): 3 mL via INTRAVENOUS

## 2016-12-27 MED ORDER — SODIUM CHLORIDE 0.9 % IJ SOLN
INTRAMUSCULAR | Status: AC
  Filled 2016-12-27: qty 20

## 2016-12-27 MED ORDER — ACETAMINOPHEN 10 MG/ML IV SOLN
INTRAVENOUS | Status: AC
  Filled 2016-12-27: qty 100

## 2016-12-27 MED ORDER — GABAPENTIN 300 MG PO CAPS
300.0000 mg | ORAL_CAPSULE | Freq: Three times a day (TID) | ORAL | Status: DC
  Administered 2016-12-27 – 2016-12-30 (×8): 300 mg via ORAL
  Filled 2016-12-27 (×8): qty 1

## 2016-12-27 MED ORDER — THROMBIN 20000 UNITS EX SOLR
CUTANEOUS | Status: AC
  Filled 2016-12-27: qty 20000

## 2016-12-27 MED ORDER — FENTANYL CITRATE (PF) 100 MCG/2ML IJ SOLN
INTRAMUSCULAR | Status: DC | PRN
  Administered 2016-12-27 (×2): 50 ug via INTRAVENOUS
  Administered 2016-12-27: 150 ug via INTRAVENOUS

## 2016-12-27 MED ORDER — PHENYLEPHRINE HCL 10 MG/ML IJ SOLN
INTRAVENOUS | Status: DC | PRN
  Administered 2016-12-27: 50 ug/min via INTRAVENOUS

## 2016-12-27 MED ORDER — ONDANSETRON HCL 4 MG/2ML IJ SOLN: 4.0000 mg | Freq: Four times a day (QID) | INTRAMUSCULAR | Status: DC | PRN

## 2016-12-27 MED ORDER — KETOROLAC TROMETHAMINE 30 MG/ML IJ SOLN
INTRAMUSCULAR | Status: AC
  Filled 2016-12-27: qty 1

## 2016-12-27 MED ORDER — ACETAMINOPHEN 500 MG PO TABS
1000.0000 mg | ORAL_TABLET | Freq: Four times a day (QID) | ORAL | Status: DC
  Administered 2016-12-28 – 2016-12-30 (×9): 1000 mg via ORAL
  Filled 2016-12-27 (×11): qty 2

## 2016-12-27 MED ORDER — VANCOMYCIN HCL 1000 MG IV SOLR
INTRAVENOUS | Status: AC
  Filled 2016-12-27: qty 1000

## 2016-12-27 MED ORDER — LIDOCAINE-EPINEPHRINE 2 %-1:100000 IJ SOLN
INTRAMUSCULAR | Status: DC | PRN
  Administered 2016-12-27: 10 mL
  Administered 2016-12-27: 5 mL via INTRADERMAL

## 2016-12-27 MED ORDER — OXYCODONE HCL ER 20 MG PO T12A
20.0000 mg | EXTENDED_RELEASE_TABLET | Freq: Two times a day (BID) | ORAL | Status: DC
  Administered 2016-12-27 – 2016-12-30 (×6): 20 mg via ORAL
  Filled 2016-12-27 (×6): qty 1

## 2016-12-27 MED FILL — Heparin Sodium (Porcine) Inj 1000 Unit/ML: INTRAMUSCULAR | Qty: 30 | Status: AC

## 2016-12-27 MED FILL — Sodium Chloride IV Soln 0.9%: INTRAVENOUS | Qty: 2000 | Status: AC

## 2016-12-27 SURGICAL SUPPLY — 142 items
ADH SKN CLS APL DERMABOND .7 (GAUZE/BANDAGES/DRESSINGS) ×4
APL SKNCLS STERI-STRIP NONHPOA (GAUZE/BANDAGES/DRESSINGS) ×2
BAG DECANTER FOR FLEXI CONT (MISCELLANEOUS) ×4 IMPLANT
BATTALION LLIF ITRADISCAL SHIM (MISCELLANEOUS) ×4
BENZOIN TINCTURE PRP APPL 2/3 (GAUZE/BANDAGES/DRESSINGS) ×4 IMPLANT
BIT DRILL LONG 3.0X30 (BIT) ×1 IMPLANT
BIT DRILL LONG 3.0X30MM (BIT) ×1
BIT DRILL LONG 3X80 (BIT) IMPLANT
BIT DRILL LONG 3X80MM (BIT)
BIT DRILL LONG 4X80 (BIT) IMPLANT
BIT DRILL LONG 4X80MM (BIT)
BIT DRILL SHORT 3.0X30 (BIT) IMPLANT
BIT DRILL SHORT 3.0X30MM (BIT)
BIT DRILL SHORT 3X80 (BIT) IMPLANT
BIT DRILL SHORT 3X80MM (BIT)
BLADE CLIPPER SURG (BLADE) IMPLANT
BLADE SURG 10 STRL SS (BLADE) ×4 IMPLANT
BLADE SURG 11 STRL SS (BLADE) ×8 IMPLANT
BUR MATCHSTICK NEURO 3.0 LAGG (BURR) ×4 IMPLANT
BUR ROUND FLUTED 5 RND (BURR) ×3 IMPLANT
BUR ROUND FLUTED 5MM RND (BURR) ×1
CABLE BATTALION LLIF LIGHT (MISCELLANEOUS) ×2 IMPLANT
CAGE MOJAVE W-SCREW 11X24X9-12 (Cage) ×4 IMPLANT
CANISTER SUCT 3000ML PPV (MISCELLANEOUS) ×8 IMPLANT
CARTRIDGE OIL MAESTRO DRILL (MISCELLANEOUS) ×4 IMPLANT
CHLORAPREP W/TINT 26ML (MISCELLANEOUS) ×8 IMPLANT
CONT SPEC 4OZ CLIKSEAL STRL BL (MISCELLANEOUS) ×2 IMPLANT
COVER BACK TABLE 24X17X13 BIG (DRAPES) IMPLANT
DECANTER SPIKE VIAL GLASS SM (MISCELLANEOUS) ×8 IMPLANT
DERMABOND ADVANCED (GAUZE/BANDAGES/DRESSINGS) ×4
DERMABOND ADVANCED .7 DNX12 (GAUZE/BANDAGES/DRESSINGS) ×6 IMPLANT
DIFFUSER DRILL AIR PNEUMATIC (MISCELLANEOUS) ×8 IMPLANT
DILATOR INSULATED ML04405 (MISCELLANEOUS) ×2 IMPLANT
DISSECTOR BLUNT TIP ENDO 5MM (MISCELLANEOUS) IMPLANT
DRAPE C-ARM 42X72 X-RAY (DRAPES) ×12 IMPLANT
DRAPE C-ARMOR (DRAPES) ×6 IMPLANT
DRAPE LAPAROTOMY 100X72X124 (DRAPES) ×4 IMPLANT
DRAPE MICROSCOPE LEICA (MISCELLANEOUS) IMPLANT
DRAPE POUCH INSTRU U-SHP 10X18 (DRAPES) ×8 IMPLANT
DRAPE SHEET LG 3/4 BI-LAMINATE (DRAPES) ×4 IMPLANT
DRAPE SURG 17X23 STRL (DRAPES) ×4 IMPLANT
DRSG OPSITE 4X5.5 SM (GAUZE/BANDAGES/DRESSINGS) ×2 IMPLANT
DRSG OPSITE POSTOP 3X4 (GAUZE/BANDAGES/DRESSINGS) ×2 IMPLANT
DRSG OPSITE POSTOP 4X8 (GAUZE/BANDAGES/DRESSINGS) ×2 IMPLANT
ELECT BLADE 4.0 EZ CLEAN MEGAD (MISCELLANEOUS)
ELECT COATED BLADE 2.86 ST (ELECTRODE) ×10 IMPLANT
ELECT REM PT RETURN 9FT ADLT (ELECTROSURGICAL) ×8
ELECTRODE BLDE 4.0 EZ CLN MEGD (MISCELLANEOUS) IMPLANT
ELECTRODE REM PT RTRN 9FT ADLT (ELECTROSURGICAL) ×4 IMPLANT
EVACUATOR 1/8 PVC DRAIN (DRAIN) ×2 IMPLANT
FEE INTRAOP MONITOR IMPULS NCS (MISCELLANEOUS) IMPLANT
GAUZE SPONGE 4X4 12PLY STRL (GAUZE/BANDAGES/DRESSINGS) IMPLANT
GAUZE SPONGE 4X4 12PLY STRL LF (GAUZE/BANDAGES/DRESSINGS) ×4 IMPLANT
GAUZE SPONGE 4X4 16PLY XRAY LF (GAUZE/BANDAGES/DRESSINGS) ×2 IMPLANT
GLOVE BIO SURGEON STRL SZ7 (GLOVE) IMPLANT
GLOVE BIOGEL PI IND STRL 6.5 (GLOVE) IMPLANT
GLOVE BIOGEL PI IND STRL 7.0 (GLOVE) IMPLANT
GLOVE BIOGEL PI IND STRL 7.5 (GLOVE) ×6 IMPLANT
GLOVE BIOGEL PI INDICATOR 6.5 (GLOVE) ×4
GLOVE BIOGEL PI INDICATOR 7.0 (GLOVE)
GLOVE BIOGEL PI INDICATOR 7.5 (GLOVE) ×10
GLOVE EXAM NITRILE LRG STRL (GLOVE) IMPLANT
GLOVE EXAM NITRILE XL STR (GLOVE) IMPLANT
GLOVE EXAM NITRILE XS STR PU (GLOVE) IMPLANT
GLOVE SS BIOGEL STRL SZ 7.5 (GLOVE) ×8 IMPLANT
GLOVE SUPERSENSE BIOGEL SZ 7.5 (GLOVE) ×14
GLOVE SURG SS PI 6.5 STRL IVOR (GLOVE) ×6 IMPLANT
GOWN STRL REUS W/ TWL LRG LVL3 (GOWN DISPOSABLE) ×6 IMPLANT
GOWN STRL REUS W/ TWL XL LVL3 (GOWN DISPOSABLE) ×2 IMPLANT
GOWN STRL REUS W/TWL 2XL LVL3 (GOWN DISPOSABLE) IMPLANT
GOWN STRL REUS W/TWL LRG LVL3 (GOWN DISPOSABLE) ×24
GOWN STRL REUS W/TWL XL LVL3 (GOWN DISPOSABLE) ×4
GRAFT BN 10X1XDBM MAGNIFUSE (Bone Implant) IMPLANT
GRAFT BONE MAGNIFUSE 1X10CM (Bone Implant) ×4 IMPLANT
GUIDEWIRE 320MM BLUNT TIP (WIRE) ×2 IMPLANT
HEMOSTAT POWDER KIT SURGIFOAM (HEMOSTASIS) ×8 IMPLANT
INTRAOP MONITOR FEE IMPULS NCS (MISCELLANEOUS) ×2
INTRAOP MONITOR FEE IMPULSE (MISCELLANEOUS) ×2
IV NS 1000ML (IV SOLUTION) ×4
IV NS 1000ML BAXH (IV SOLUTION) IMPLANT
KIT BASIN OR (CUSTOM PROCEDURE TRAY) ×8 IMPLANT
KIT INFUSE XX SMALL 0.7CC (Orthopedic Implant) ×4 IMPLANT
KIT ROOM TURNOVER OR (KITS) ×8 IMPLANT
KIT SPINE MAZOR X ROBO DISP (MISCELLANEOUS) ×4 IMPLANT
LEAD WIRE MULTI STAGE DISP (NEUROSURGERY SUPPLIES) ×2 IMPLANT
MARKER SKIN DUAL TIP RULER LAB (MISCELLANEOUS) ×2 IMPLANT
MILL MEDIUM DISP (BLADE) ×2 IMPLANT
NDL HYPO 18GX1.5 BLUNT FILL (NEEDLE) ×2 IMPLANT
NDL HYPO 21X1.5 SAFETY (NEEDLE) ×6 IMPLANT
NDL HYPO 25X1 1.5 SAFETY (NEEDLE) ×4 IMPLANT
NEEDLE HYPO 18GX1.5 BLUNT FILL (NEEDLE) ×4 IMPLANT
NEEDLE HYPO 21X1.5 SAFETY (NEEDLE) ×16 IMPLANT
NEEDLE HYPO 25X1 1.5 SAFETY (NEEDLE) ×8 IMPLANT
NS IRRIG 1000ML POUR BTL (IV SOLUTION) ×8 IMPLANT
OIL CARTRIDGE MAESTRO DRILL (MISCELLANEOUS) ×8
PACK LAMINECTOMY NEURO (CUSTOM PROCEDURE TRAY) ×8 IMPLANT
PACK UNIVERSAL I (CUSTOM PROCEDURE TRAY) ×4 IMPLANT
PAD ARMBOARD 7.5X6 YLW CONV (MISCELLANEOUS) ×14 IMPLANT
PATTIES SURGICAL .5X1.5 (GAUZE/BANDAGES/DRESSINGS) ×4 IMPLANT
PIN HEAD 2.5X60MM (PIN) IMPLANT
PROBE BALL TIP STIM 200MM 2.3M (NEUROSURGERY SUPPLIES) ×2 IMPLANT
PUTTY DBM GRAFTON 5CC (Putty) ×2 IMPLANT
ROD PC 5.5X60 TI ARSENAL (Rod) ×4 IMPLANT
RUBBERBAND STERILE (MISCELLANEOUS) IMPLANT
SCREW CBX 6.5X35MM (Screw) ×12 IMPLANT
SCREW SCHANZ 4MMX80 (MISCELLANEOUS) ×2 IMPLANT
SCREW SCHANZ SA 4.0MM (MISCELLANEOUS) ×2 IMPLANT
SCREW SET SPINAL ARSENAL 47127 (Screw) ×12 IMPLANT
SEALER BIPOLAR AQUA 2.3 (INSTRUMENTS) ×2 IMPLANT
SHIM ITRADISCAL BATTALION LLIF (MISCELLANEOUS) IMPLANT
SPACER BATTALION 22X10X55M 10 (Spacer) ×2 IMPLANT
SPONGE DRAIN TRACH 4X4 STRL 2S (GAUZE/BANDAGES/DRESSINGS) ×2 IMPLANT
SPONGE LAP 4X18 X RAY DECT (DISPOSABLE) IMPLANT
SPONGE NEURO XRAY DETECT 1X3 (DISPOSABLE) ×4 IMPLANT
SPONGE SURGIFOAM ABS GEL 100 (HEMOSTASIS) ×4 IMPLANT
STAPLER VISISTAT 35W (STAPLE) ×4 IMPLANT
STRIP SURGICAL 1 X 6 IN (GAUZE/BANDAGES/DRESSINGS) IMPLANT
STRIP SURGICAL 1/2 X 6 IN (GAUZE/BANDAGES/DRESSINGS) IMPLANT
STRIP SURGICAL 1/4 X 6 IN (GAUZE/BANDAGES/DRESSINGS) IMPLANT
STRIP SURGICAL 3/4 X 6 IN (GAUZE/BANDAGES/DRESSINGS) IMPLANT
SUT STRATAFIX MNCRL+ 3-0 PS-2 (SUTURE) ×2
SUT STRATAFIX MONOCRYL 3-0 (SUTURE) ×2
SUT VIC AB 0 CT1 18XCR BRD8 (SUTURE) ×4 IMPLANT
SUT VIC AB 0 CT1 8-18 (SUTURE) ×12
SUT VIC AB 1 CT1 18XBRD ANBCTR (SUTURE) ×2 IMPLANT
SUT VIC AB 1 CT1 8-18 (SUTURE) ×12
SUT VIC AB 2-0 CT1 18 (SUTURE) ×14 IMPLANT
SUT VIC AB 3-0 SH 8-18 (SUTURE) ×16 IMPLANT
SUT VIC AB 4-0 PS2 27 (SUTURE) ×2 IMPLANT
SUT VICRYL 3-0 RB1 18 ABS (SUTURE) ×2 IMPLANT
SUTURE STRATFX MNCRL+ 3-0 PS-2 (SUTURE) IMPLANT
SYR 30ML LL (SYRINGE) ×12 IMPLANT
SYR 3ML LL SCALE MARK (SYRINGE) ×4 IMPLANT
SYR 5ML LL (SYRINGE) ×4 IMPLANT
TIP TROCAR NITINOL ILLICO 20 (INSTRUMENTS) ×12 IMPLANT
TOWEL GREEN STERILE (TOWEL DISPOSABLE) ×8 IMPLANT
TOWEL GREEN STERILE FF (TOWEL DISPOSABLE) ×10 IMPLANT
TRAY FOLEY W/METER SILVER 16FR (SET/KITS/TRAYS/PACK) ×4 IMPLANT
TUBE CONNECTING 12'X1/4 (SUCTIONS) ×1
TUBE CONNECTING 12X1/4 (SUCTIONS) ×3 IMPLANT
TUBE MAZOR SA REDUCTION (TUBING) ×2 IMPLANT
WATER STERILE IRR 1000ML POUR (IV SOLUTION) ×8 IMPLANT

## 2016-12-27 NOTE — Anesthesia Procedure Notes (Signed)
Procedure Name: Intubation Date/Time: 12/27/2016 7:57 AM Performed by: Clearnce Sorrel Pre-anesthesia Checklist: Patient identified, Emergency Drugs available, Suction available, Patient being monitored and Timeout performed Patient Re-evaluated:Patient Re-evaluated prior to induction Oxygen Delivery Method: Circle system utilized Preoxygenation: Pre-oxygenation with 100% oxygen Induction Type: IV induction Ventilation: Mask ventilation without difficulty and Oral airway inserted - appropriate to patient size Laryngoscope Size: Mac and 3 Grade View: Grade II Tube type: Oral Tube size: 7.5 mm Number of attempts: 1 Airway Equipment and Method: Stylet Placement Confirmation: ETT inserted through vocal cords under direct vision,  positive ETCO2 and breath sounds checked- equal and bilateral Secured at: 23 cm Tube secured with: Tape Dental Injury: Teeth and Oropharynx as per pre-operative assessment

## 2016-12-27 NOTE — Anesthesia Preprocedure Evaluation (Addendum)
Anesthesia Evaluation  Patient identified by MRN, date of birth, ID band Patient awake    Reviewed: Allergy & Precautions, NPO status , Patient's Chart, lab work & pertinent test results  Airway Mallampati: III  TM Distance: >3 FB Neck ROM: Full    Dental no notable dental hx.    Pulmonary former smoker,    Pulmonary exam normal breath sounds clear to auscultation       Cardiovascular hypertension, Pt. on medications Normal cardiovascular exam Rhythm:Regular Rate:Normal  ECG: NSR, rate 83   Neuro/Psych  Headaches, negative psych ROS   GI/Hepatic negative GI ROS, Neg liver ROS,   Endo/Other  negative endocrine ROS  Renal/GU negative Renal ROS     Musculoskeletal Spondylolisthesis, Lumbosacral region   Abdominal (+) + obese,   Peds  Hematology negative hematology ROS (+)   Anesthesia Other Findings   Reproductive/Obstetrics                            Anesthesia Physical Anesthesia Plan  ASA: II  Anesthesia Plan: General   Post-op Pain Management:    Induction: Intravenous  PONV Risk Score and Plan: 2 and Ondansetron and Dexamethasone  Airway Management Planned: Oral ETT  Additional Equipment:   Intra-op Plan:   Post-operative Plan: Extubation in OR  Informed Consent: I have reviewed the patients History and Physical, chart, labs and discussed the procedure including the risks, benefits and alternatives for the proposed anesthesia with the patient or authorized representative who has indicated his/her understanding and acceptance.   Dental advisory given  Plan Discussed with: CRNA  Anesthesia Plan Comments:         Anesthesia Quick Evaluation

## 2016-12-27 NOTE — Anesthesia Postprocedure Evaluation (Signed)
Anesthesia Post Note  Patient: Steven Ross  Procedure(s) Performed: Procedure(s) (LRB): Lumbar four-five Transpsoas lumbar interbody fusion with Lumbar five-Sacrum one  Posterior lumbar interbody fusion, Lumbar four-five  Gill procedure, Lumbar five-Sacrum one Laminectomy for decompression, Lumbar four-Sacrum one cortical trajectory screw fixation and fusion with Mazor (N/A) APPLICATION OF ROBOTIC ASSISTANCE FOR SPINAL PROCEDURE (N/A) POSTERIOR LUMBAR FUSION 2 LEVEL (N/A)     Patient location during evaluation: PACU Anesthesia Type: General Level of consciousness: awake and alert Pain management: pain level controlled Vital Signs Assessment: post-procedure vital signs reviewed and stable Respiratory status: spontaneous breathing, nonlabored ventilation, respiratory function stable and patient connected to nasal cannula oxygen Cardiovascular status: blood pressure returned to baseline and stable Postop Assessment: no signs of nausea or vomiting Anesthetic complications: no    Last Vitals:  Vitals:   12/27/16 1615 12/27/16 1645  BP: 99/67 104/72  Pulse: 77 73  Resp: 14 11  Temp:    SpO2: 100% 95%    Last Pain:  Vitals:   12/27/16 1430  TempSrc:   PainSc: Asleep                 [name] P Karmella Bouvier

## 2016-12-27 NOTE — Progress Notes (Signed)
Pt arrived to 5C09 via stretcher.  Pt alert and oriented, sleepy.  VSS.  Dressing clean dry and intact.  Will continue to monitor.  Cori Razor, RN

## 2016-12-27 NOTE — Transfer of Care (Signed)
Immediate Anesthesia Transfer of Care Note  Patient: Steven Ross  Procedure(s) Performed: Procedure(s): Lumbar four-five Transpsoas lumbar interbody fusion with Lumbar five-Sacrum one  Posterior lumbar interbody fusion, Lumbar four-five  Gill procedure, Lumbar five-Sacrum one Laminectomy for decompression, Lumbar four-Sacrum one cortical trajectory screw fixation and fusion with Mazor (N/A) APPLICATION OF ROBOTIC ASSISTANCE FOR SPINAL PROCEDURE (N/A) POSTERIOR LUMBAR FUSION 2 LEVEL (N/A)  Patient Location: PACU  Anesthesia Type:General  Level of Consciousness: awake, alert  and oriented  Airway & Oxygen Therapy: Patient Spontanous Breathing and Patient connected to nasal cannula oxygen  Post-op Assessment: Report given to RN and Post -op Vital signs reviewed and stable  Post vital signs: Reviewed and stable  Last Vitals:  Vitals:   12/27/16 0648 12/27/16 1357  BP: (!) 176/106   Pulse:  85  Resp:    Temp:  36.8 C  SpO2:  97%    Last Pain:  Vitals:   12/27/16 0627  TempSrc:   PainSc: 3          Complications: No apparent anesthesia complications

## 2016-12-27 NOTE — Brief Op Note (Signed)
12/27/2016  1:37 PM  PATIENT:  Steven Ross  61 y.o. male  PRE-OPERATIVE DIAGNOSIS:  Spondylolisthesis, Lumbosacral region  POST-OPERATIVE DIAGNOSIS:  Spondylolisthesis, Lumbosacral region  PROCEDURE:  Procedure(s): Lumbar four-five Transpsoas lumbar interbody fusion with Lumbar five-Sacrum one  Posterior lumbar interbody fusion, Lumbar four-five  Gill procedure, Lumbar five-Sacrum one Laminectomy for decompression, Lumbar four-Sacrum one cortical trajectory screw fixation and fusion with Mazor (N/A) APPLICATION OF ROBOTIC ASSISTANCE FOR SPINAL PROCEDURE (N/A) POSTERIOR LUMBAR FUSION 2 LEVEL (N/A)  SURGEON:  Surgeon(s) and Role:    * Ditty, Kevan Ny, MD - Primary  PHYSICIAN ASSISTANT: Ferne Reus, PA-C  ANESTHESIA:   general  EBL:  Total I/O In: 7414 [I.V.:1000; Blood:125] Out: 1775 [Urine:1275; Blood:500]  BLOOD ADMINISTERED:100 CC CELLSAVER  DRAINS: Medium hemovac drain   LOCAL MEDICATIONS USED:  MARCAINE    and LIDOCAINE   SPECIMEN:  No Specimen  DISPOSITION OF SPECIMEN:  N/A  COUNTS:  YES  TOURNIQUET:  * No tourniquets in log *  DICTATION: .Note written in EPIC  PLAN OF CARE: Admit for overnight observation  PATIENT DISPOSITION:  PACU - hemodynamically stable.   Delay start of Pharmacological VTE agent (>24hrs) due to surgical blood loss or risk of bleeding: yes

## 2016-12-27 NOTE — H&P (Signed)
Chief Complaint   No chief complaint on file. Low back pain  HPI   HPI: Steven Ross is a 61 y.o. male who presents for elective surgery. He has a long standing history of chronic back pain with radiation into both lower extremities consistent with neurogenic claudication. He had an MRI done which showed severe spondylosis and stenosis at L4-5. He presents today for surgery. He feels well today. He is without concerns.  There are no active problems to display for this patient.   PMH: Past Medical History:  Diagnosis Date  . Headache   . Hypertension   . Radiculopathy, lumbosacral region 2012  . Spondylolisthesis of lumbosacral region     PSH: Past Surgical History:  Procedure Laterality Date  . ABSCESS DRAINAGE      Prescriptions Prior to Admission  Medication Sig Dispense Refill Last Dose  . gabapentin (NEURONTIN) 300 MG capsule Take 300 mg by mouth 3 (three) times daily.  2 12/27/2016 at 0430  . lisinopril (PRINIVIL,ZESTRIL) 10 MG tablet Take 10 mg by mouth daily.  2 12/26/2016 at Unknown time  . diclofenac (VOLTAREN) 75 MG EC tablet Take 75 mg by mouth 2 (two) times daily.  2 Unknown at Unknown time  . meloxicam (MOBIC) 15 MG tablet Take 1 tablet (15 mg total) by mouth daily as needed for pain. (Patient not taking: Reported on 12/26/2016) 15 tablet 0 Completed Course at Unknown time  . oxyCODONE-acetaminophen (PERCOCET/ROXICET) 5-325 MG tablet Take 1 tablet by mouth every 6 (six) hours as needed for severe pain. (Patient not taking: Reported on 12/26/2016) 12 tablet 0 Completed Course at Unknown time    SH: Social History  Substance Use Topics  . Smoking status: Former Smoker    Types: Cigarettes  . Smokeless tobacco: Never Used     Comment: quit smoking cigarettes in 2012  . Alcohol use No    MEDS: Prior to Admission medications   Medication Sig Start Date End Date Taking? Authorizing Provider  gabapentin (NEURONTIN) 300 MG capsule Take 300 mg by mouth 3 (three) times  daily. 11/28/16  Yes [provider]  lisinopril (PRINIVIL,ZESTRIL) 10 MG tablet Take 10 mg by mouth daily. 12/04/16  Yes [provider]  diclofenac (VOLTAREN) 75 MG EC tablet Take 75 mg by mouth 2 (two) times daily. 11/28/16   [provider]  meloxicam (MOBIC) 15 MG tablet Take 1 tablet (15 mg total) by mouth daily as needed for pain. Patient not taking: Reported on 12/26/2016 10/02/15   Virgel Manifold, MD  oxyCODONE-acetaminophen (PERCOCET/ROXICET) 5-325 MG tablet Take 1 tablet by mouth every 6 (six) hours as needed for severe pain. Patient not taking: Reported on 12/26/2016 10/02/15   Virgel Manifold, MD    ALLERGY: No Known Allergies  Social History  Substance Use Topics  . Smoking status: Former Smoker    Types: Cigarettes  . Smokeless tobacco: Never Used     Comment: quit smoking cigarettes in 2012  . Alcohol use No     History reviewed. No pertinent family history.   ROS   Review of Systems  Constitutional: Negative.   HENT: Negative.   Respiratory: Negative.   Cardiovascular: Negative.   Gastrointestinal: Negative.   Genitourinary: Negative.   Musculoskeletal: Positive for back pain, joint pain and myalgias.  Skin: Negative.   Neurological: Positive for tingling. Negative for dizziness, tremors, sensory change, speech change, focal weakness, seizures, loss of consciousness and headaches.    Exam   Vitals:   12/27/16 0645 12/27/16  0648  BP: (!) 191/104 (!) 176/106  Pulse:    Resp:    Temp:    SpO2:     General appearance: WDWN, NAD Eyes: PERRL Cardiovascular: Regular rate and rhythm without murmurs, rubs, gallops. No edema or variciosities. Distal pulses normal. Pulmonary: Clear to auscultation Musculoskeletal:     Muscle tone upper extremities: Normal    Muscle tone lower extremities: Normal    Motor exam: Upper Extremities Deltoid Bicep Tricep Grip  Right 5/5 5/5 5/5 5/5  Left 5/5 5/5 5/5 5/5   Lower Extremity IP Quad PF DF EHL   Right 5/5 5/5 5/5 5/5 5/5  Left 5/5 5/5 5/5 5/5 5/5   Neurological Awake, alert, oriented Memory and concentration grossly intact Speech fluent, appropriate CNII: Visual fields normal CNIII/IV/VI: EOMI CNV: Facial sensation normal CNVII: Symmetric, normal strength CNVIII: Grossly normal CNIX: Normal palate movement CNXI: Trap and SCM strength normal CN XII: Tongue protrusion normal Sensation grossly intact to LT DTR: Normal Coordination (finger/nose & heel/shin): Normal  Results - Imaging/Labs   Results for orders placed or performed during the hospital encounter of 12/26/16 (from the past 48 hour(s))  Surgical pcr screen     Status: None   Collection Time: 12/26/16 11:30 AM  Result Value Ref Range   MRSA, PCR NEGATIVE NEGATIVE   Staphylococcus aureus NEGATIVE NEGATIVE    Comment: (NOTE) The Xpert SA Assay (FDA approved for NASAL specimens in patients 25 years of age and older), is one component of a comprehensive surveillance program. It is not intended to diagnose infection nor to guide or monitor treatment.   Basic metabolic panel     Status: None   Collection Time: 12/26/16 11:31 AM  Result Value Ref Range   Sodium 140 135 - 145 mmol/L   Potassium 4.0 3.5 - 5.1 mmol/L   Chloride 107 101 - 111 mmol/L   CO2 28 22 - 32 mmol/L   Glucose, Bld 92 65 - 99 mg/dL   BUN 16 6 - 20 mg/dL   Creatinine, Ser 0.75 0.61 - 1.24 mg/dL   Calcium 9.0 8.9 - 10.3 mg/dL   GFR calc non Af Amer >60 >60 mL/min   GFR calc Af Amer >60 >60 mL/min    Comment: (NOTE) The eGFR has been calculated using the CKD EPI equation. This calculation has not been validated in all clinical situations. eGFR's persistently <60 mL/min signify possible Chronic Kidney Disease.    Anion gap 5 5 - 15  CBC     Status: None   Collection Time: 12/26/16 11:31 AM  Result Value Ref Range   WBC 7.8 4.0 - 10.5 K/uL   RBC 4.98 4.22 - 5.81 MIL/uL   Hemoglobin 14.8 13.0 - 17.0 g/dL   HCT 44.7 39.0 - 52.0 %   MCV  89.8 78.0 - 100.0 fL   MCH 29.7 26.0 - 34.0 pg   MCHC 33.1 30.0 - 36.0 g/dL   RDW 13.6 11.5 - 15.5 %   Platelets 182 150 - 400 K/uL  Type and screen     Status: None   Collection Time: 12/26/16 11:44 AM  Result Value Ref Range   ABO/RH(D) A POS    Antibody Screen NEG    Sample Expiration 01/09/2017    Extend sample reason NO TRANSFUSIONS OR PREGNANCY IN THE PAST 3 MONTHS   ABO/Rh     Status: None   Collection Time: 12/26/16 11:44 AM  Result Value Ref Range   ABO/RH(D) A POS  No results found.  Impression/Plan   61 y.o. male with lumbar radiculopathy with neurogenic claudication. He presents today for L4-5 Transpsoas lumbar interbody fusion with L5-S1 Posterior lumbar interbody fusion, L4-5 Gill procedure, L5-S1 Laminectomy for decompression, L4-S1 cortical trajectory screw fixation and fusion with Mazor (N/A ); APPLICATION OF ROBOTIC ASSISTANCE FOR SPINAL PROCEDURE (N/A ); POSTERIOR LUMBAR FUSION 2 LEVEL (N/A ). We have discussed risks, benefits and alternatives to the procedure and wishes to proceed. All questions have been sought and answered.

## 2016-12-28 LAB — BASIC METABOLIC PANEL
ANION GAP: 9 (ref 5–15)
BUN: 11 mg/dL (ref 6–20)
CO2: 26 mmol/L (ref 22–32)
Calcium: 8.2 mg/dL — ABNORMAL LOW (ref 8.9–10.3)
Chloride: 104 mmol/L (ref 101–111)
Creatinine, Ser: 0.89 mg/dL (ref 0.61–1.24)
GLUCOSE: 141 mg/dL — AB (ref 65–99)
POTASSIUM: 3.6 mmol/L (ref 3.5–5.1)
Sodium: 139 mmol/L (ref 135–145)

## 2016-12-28 LAB — CBC
HEMATOCRIT: 33 % — AB (ref 39.0–52.0)
HEMOGLOBIN: 10.8 g/dL — AB (ref 13.0–17.0)
MCH: 29.1 pg (ref 26.0–34.0)
MCHC: 32.7 g/dL (ref 30.0–36.0)
MCV: 88.9 fL (ref 78.0–100.0)
Platelets: 149 10*3/uL — ABNORMAL LOW (ref 150–400)
RBC: 3.71 MIL/uL — AB (ref 4.22–5.81)
RDW: 13.9 % (ref 11.5–15.5)
WBC: 13.5 10*3/uL — ABNORMAL HIGH (ref 4.0–10.5)

## 2016-12-28 MED ORDER — PANTOPRAZOLE SODIUM 40 MG PO TBEC
40.0000 mg | DELAYED_RELEASE_TABLET | Freq: Every day | ORAL | Status: DC
  Administered 2016-12-28 – 2016-12-29 (×2): 40 mg via ORAL
  Filled 2016-12-28 (×2): qty 1

## 2016-12-28 NOTE — Evaluation (Signed)
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 PMH including but not limited to HTN.  Clinical Impression  Pt presented supine in bed with HOB elevated, awake and willing to participate in therapy session. Pt's spouse present throughout session (but mostly sleeping). Prior to admission, pt reported that he ambulated with use of SPC PRN and was independent with ADLs. Pt ambulated in hallway with close min guard with use of RW. PT reviewed 3/3 back precautions with pt throughout session. PT will continue to follow acutely for mobility progression and to ensure a safe d/c home.    Follow Up Recommendations No PT follow up;Supervision/Assistance - 24 hour    Equipment Recommendations  Rolling walker with 5" wheels    Recommendations for Other Services       Precautions / Restrictions Precautions Precautions: Fall;Back Precaution Comments: PT reviewed 3/3 back precautions and log roll technique throughout session Required Braces or Orthoses: Spinal Brace Spinal Brace: Lumbar corset;Applied in sitting position Restrictions Weight Bearing Restrictions: No      Mobility  Bed Mobility Overal bed mobility: Needs Assistance Bed Mobility: Rolling;Sidelying to Sit;Sit to Sidelying Rolling: Supervision Sidelying to sit: Mod assist     Sit to sidelying: Min assist General bed mobility comments: increased time and effort, cueing for log roll technique, assist to elevate trunk and assist to return bilateral LEs back onto bed  Transfers Overall transfer level: Needs assistance Equipment used: None Transfers: Sit to/from Stand Sit to Stand: Min guard         General transfer comment: increased time and effort  Ambulation/Gait Ambulation/Gait assistance: Min guard Ambulation Distance (Feet): 100  Feet Assistive device: Rolling walker (2 wheeled) Gait Pattern/deviations: Step-through pattern;Decreased step length - right;Decreased step length - left;Decreased stride length Gait velocity: decreased Gait velocity interpretation: Below normal speed for age/gender General Gait Details: slow, cautious gait pattern with min guard for safety with use of RW  Stairs            Wheelchair Mobility    Modified Rankin (Stroke Patients Only)       Balance Overall balance assessment: Needs assistance Sitting-balance support: Feet supported Sitting balance-Leahy Scale: Good     Standing balance support: During functional activity;Bilateral upper extremity supported Standing balance-Leahy Scale: Poor Standing balance comment: reliant on bilateral UEs on RW                             Pertinent Vitals/Pain Pain Assessment: 0-10 Pain Score: 4  Pain Location: back  Pain Descriptors / Indicators: Sore Pain Intervention(s): Monitored during session;Repositioned    Home Living Family/patient expects to be discharged to:: Private residence Living Arrangements: Spouse/significant other;Children Available Help at Discharge: Family;Available 24 hours/day Type of Home: House Home Access: Stairs to enter Entrance Stairs-Rails: Right;Left;Can reach both Entrance Stairs-Number of Steps: 4 Home Layout: One level Home Equipment: Shower seat;Cane - single point      Prior Function Level of Independence: Independent with assistive device(s)         Comments: pt ambulated with [facility] PRN     Hand Dominance        Extremity/Trunk Assessment   Upper Extremity Assessment Upper Extremity Assessment: Defer to OT evaluation    Lower Extremity Assessment Lower Extremity Assessment: Generalized weakness    Cervical /  Trunk Assessment Cervical / Trunk Assessment: Other exceptions Cervical / Trunk Exceptions: s/p lumbar sx  Communication   Communication: No  difficulties  Cognition Arousal/Alertness: Awake/alert Behavior During Therapy: WFL for tasks assessed/performed Overall Cognitive Status: Within Functional Limits for tasks assessed                                 General Comments: cognition was not formally assessed but [facility] for general conversation      General Comments      Exercises     Assessment/Plan    PT Assessment Patient needs continued PT services  PT Problem List Decreased balance;Decreased mobility;Decreased coordination;Decreased knowledge of use of DME;Decreased knowledge of precautions;Pain       PT Treatment Interventions DME instruction;Gait training;Functional mobility training;Stair training;Therapeutic activities;Therapeutic exercise;Neuromuscular re-education;Balance training;Patient/family education    PT Goals (Current goals can be found in the Care Plan section)  Acute Rehab PT Goals Patient Stated Goal: decrease pain PT Goal Formulation: With patient Time For Goal Achievement: 01/11/17 Potential to Achieve Goals: Good    Frequency Min 5X/week   Barriers to discharge        Co-evaluation               AM-PAC PT "6 Clicks" Daily Activity  Outcome Measure Difficulty turning over in bed (including adjusting bedclothes, sheets and blankets)?: A Lot Difficulty moving from lying on back to sitting on the side of the bed? : Unable Difficulty sitting down on and standing up from a chair with arms (e.g., wheelchair, bedside commode, etc,.)?: A Lot Help needed moving to and from a bed to chair (including a wheelchair)?: A Little Help needed walking in hospital room?: A Little Help needed climbing 3-5 steps with a railing? : A Little 6 Click Score: 14    End of Session Equipment Utilized During Treatment: Gait belt;Back brace Activity Tolerance: Patient tolerated treatment well Patient left: in bed;with call bell/phone within reach;with bed alarm set;with family/visitor  present Nurse Communication: Mobility status PT Visit Diagnosis: Other abnormalities of gait and mobility (R26.89)    Time: 1324-4010 PT Time Calculation (min) (ACUTE ONLY): 20 min   Charges:   PT Evaluation $PT Eval Moderate Complexity: 1 Mod     PT G Codes:        [place], PT, DPT [place] 12/28/2016, 10:53 AM

## 2016-12-28 NOTE — Evaluation (Signed)
Occupational Therapy Evaluation and Discharge Patient Details Name: Steven Ross MRN: [id number] DOB: [birth year] Today's Date: 12/28/2016    History of Present Illness Pt is a 61 y/o male s/p PLIF L4-L5 and L5-S1 secondary to spodylolisthesis. PMH including but not limited to HTN.   Clinical Impression   PTA Pt independent in ADL and mobility with SPC. Pt is min guard for standing ADL and mod A for LB ADL for back precautions. Back handout provided and reviewed adls in detail. Pt educated on: clothing between brace, never sleep in brace, set an alarm at night for medication, avoid sitting for long periods of time, correct bed positioning for sleeping, correct sequence for bed mobility, avoiding lifting more than 5 pounds and never wash directly over incision. Pt and family indicate understanding. All education is complete, OT to sign off. Thank you for the opportunity to serve this patient.      Follow Up Recommendations  No OT follow up;Supervision/Assistance - 24 hour    Equipment Recommendations  3 in 1 bedside commode    Recommendations for Other Services       Precautions / Restrictions Precautions Precautions: Fall;Back Precaution Booklet Issued: Yes (comment) Precaution Comments: reviewed handout in full Required Braces or Orthoses: Spinal Brace Spinal Brace: Lumbar corset;Applied in sitting position Restrictions Weight Bearing Restrictions: No      Mobility Bed Mobility Overal bed mobility: Needs Assistance Bed Mobility: Rolling;Sidelying to Sit Rolling: Supervision Sidelying to sit: Mod assist       General bed mobility comments: increased time and effort, cueing for log roll technique, assist to elevate trunk  Transfers Overall transfer level: Needs assistance Equipment used: Rolling walker (2 wheeled) Transfers: Sit to/from Stand Sit to Stand: Min guard         General transfer comment: increased time and effort, vc for safe hand placement    Balance  Overall balance assessment: Needs assistance Sitting-balance support: Feet supported Sitting balance-Leahy Scale: Good     Standing balance support: During functional activity;Bilateral upper extremity supported Standing balance-Leahy Scale: Fair Standing balance comment: static standing requires no UE support                           ADL either performed or assessed with clinical judgement   ADL Overall ADL's : Needs assistance/impaired Eating/Feeding: Modified independent;Sitting   Grooming: Min guard;Standing   Upper Body Bathing: Moderate assistance;With caregiver independent assisting;Sitting   Lower Body Bathing: Maximal assistance;With caregiver independent assisting;Sit to/from stand   Upper Body Dressing : Moderate assistance;Sitting Upper Body Dressing Details (indicate cue type and reason): to don brace, education provided Lower Body Dressing: Maximal assistance;With caregiver independent assisting;Sit to/from stand   Toilet Transfer: Minimal assistance;Ambulation;Regular Toilet;Grab bars;RW;Cueing for sequencing Toilet Transfer Details (indicate cue type and reason): vc for safe hand placement and awareness for twisting Toileting- Clothing Manipulation and Hygiene: Moderate assistance;Sit to/from stand Toileting - Clothing Manipulation Details (indicate cue type and reason): to manage gown, peri care not observed Tub/ Shower Transfer: Minimal assistance;Shower Technical sales engineer Details (indicate cue type and reason): demonstrated for family in room, verbalized understanding Functional mobility during ADLs: Min guard;Rolling walker;Cueing for safety General ADL Comments: Family is available and willing to help 24/7 family educated on brace as well     Vision Patient Visual Report: No change from baseline Vision Assessment?: No apparent visual deficits     Perception     Praxis      Pertinent  Vitals/Pain Pain Assessment:  0-10 Pain Score: 3  Pain Location: back  Pain Descriptors / Indicators: Sore Pain Intervention(s): Monitored during session;Repositioned     Hand Dominance Right   Extremity/Trunk Assessment Upper Extremity Assessment Upper Extremity Assessment: Overall WFL for tasks assessed   Lower Extremity Assessment Lower Extremity Assessment: Defer to PT evaluation   Cervical / Trunk Assessment Cervical / Trunk Assessment: Other exceptions Cervical / Trunk Exceptions: s/p lumbar sx   Communication Communication Communication: No difficulties   Cognition Arousal/Alertness: Awake/alert Behavior During Therapy: WFL for tasks assessed/performed Overall Cognitive Status: Within Functional Limits for tasks assessed                                     General Comments  Family present during session and received all education    Exercises     Shoulder Instructions      Home Living Family/patient expects to be discharged to:: Private residence Living Arrangements: Spouse/significant other;Children Available Help at Discharge: Family;Available 24 hours/day Type of Home: House Home Access: Stairs to enter [employer] of Steps: 4 Entrance Stairs-Rails: Right;Left;Can reach both Home Layout: One level     Bathroom Shower/Tub: Teacher, early years/pre: Standard     Home Equipment: Marine scientist - single point          Prior Functioning/Environment Level of Independence: Independent with assistive device(s)        Comments: pt ambulated with SPC PRN        OT Problem List: Decreased range of motion;Impaired balance (sitting and/or standing);Decreased knowledge of use of DME or AE;Decreased knowledge of precautions;Pain      OT Treatment/Interventions:      OT Goals(Current goals can be found in the care plan section) Acute Rehab OT Goals Patient Stated Goal: get back to gardening OT Goal Formulation: With patient Time For Goal  Achievement: 01/11/17 Potential to Achieve Goals: Good  OT Frequency:     Barriers to D/C:            Co-evaluation              AM-PAC PT "6 Clicks" Daily Activity     Outcome Measure Help from another person eating meals?: None Help from another person taking care of personal grooming?: A Little Help from another person toileting, which includes using toliet, bedpan, or urinal?: A Little Help from another person bathing (including washing, rinsing, drying)?: A Lot Help from another person to put on and taking off regular upper body clothing?: A Little Help from another person to put on and taking off regular lower body clothing?: A Lot 6 Click Score: 17   End of Session Equipment Utilized During Treatment: Rolling walker;Back brace Nurse Communication: Mobility status;Other (comment) (on toilet, pt verbalized he would use chord)  Activity Tolerance: Patient tolerated treatment well Patient left: Other (comment) (in bathroom on toilet)  OT Visit Diagnosis: Unsteadiness on feet (R26.81);Pain Pain - Right/Left: Right Pain - part of body: Leg (back)                Time: 1027-2536 OT Time Calculation (min): 24 min Charges:  OT General Charges $OT Visit: 1 Visit OT Evaluation $OT Eval Moderate Complexity: 1 Mod OT Treatments $Self Care/Home Management : 8-22 mins G-Codes:     Hulda Humphrey OTR/L [place] 12/28/2016, 4:04 PM

## 2016-12-28 NOTE — Progress Notes (Signed)
Subjective: Patient reports doing OK  Objective: Vital signs in last 24 hours: Temp:  [97.4 F (36.3 C)-98.6 F (37 C)] 98.3 F (36.8 C) (09/08 0500) Pulse Rate:  [65-87] 83 (09/08 0500) Resp:  [11-18] 18 (09/08 0500) BP: (76-121)/(52-88) 119/74 (09/08 0500) SpO2:  [91 %-100 %] 98 % (09/08 0500) Weight:  [195 lb 1.7 oz (88.5 kg)] 195 lb 1.7 oz (88.5 kg) (09/08 0300)  Intake/Output from previous day: 09/07 0730 - 09/08 0729 In: 3250 [I.V.:3025; Blood:125] Out: 4690 [Urine:3615; Drains:575; Blood:500] Intake/Output this shift: Total I/O In: 3250 [I.V.:3025; Blood:125; Other:100] Out: 4854 [Urine:3615; Drains:575; Blood:500]  Physical Exam: Strength full.  Dressing CDI.  Lab Results:  Recent Labs  12/26/16 1131 12/28/16 0614  WBC 7.8 13.5*  HGB 14.8 10.8*  HCT 44.7 33.0*  PLT 182 149*   BMET  Recent Labs  12/26/16 1131 12/28/16 0410  NA 140 139  K 4.0 3.6  CL 107 104  CO2 28 26  GLUCOSE 92 141*  BUN 16 11  CREATININE 0.75 0.89  CALCIUM 9.0 8.2*    Studies/Results: Dg Lumbar Spine 2-3 Views  Result Date: 12/27/2016 CLINICAL DATA:  61 year old male undergoing lumbar fusion. EXAM: DG C-ARM GT 120 MIN; LUMBAR SPINE - 2-3 VIEW FLUOROSCOPY TIME:  Fluoroscopy Time:  0 minutes 49 seconds Radiation Exposure Index (if provided by the fluoroscopic device): Number of Acquired Spot Images: 0 COMPARISON:  Lumbar spine CT 12/20/2016 FINDINGS: Normal lumbar segmentation demonstrated on the comparison CT. Two intraoperative fluoroscopic spot views of the lumbar spine are provided today in the AP and lateral projection. These demonstrate posterior decompression, transpedicular hardware being placed at L4, L5, and S1, and also interbody implants at L4-L5 and L5-S1. Stable mild anterolisthesis at L4-L5. IMPRESSION: Posterior decompression and fusion depicted at L4-L5 and L5-S1. Electronically Signed   By: Genevie Ann M.D.   On: 12/27/2016 13:41   Dg C-arm Gt 120 Min  Result Date:  12/27/2016 CLINICAL DATA:  61 year old male undergoing lumbar fusion. EXAM: DG C-ARM GT 120 MIN; LUMBAR SPINE - 2-3 VIEW FLUOROSCOPY TIME:  Fluoroscopy Time:  0 minutes 49 seconds Radiation Exposure Index (if provided by the fluoroscopic device): Number of Acquired Spot Images: 0 COMPARISON:  Lumbar spine CT 12/20/2016 FINDINGS: Normal lumbar segmentation demonstrated on the comparison CT. Two intraoperative fluoroscopic spot views of the lumbar spine are provided today in the AP and lateral projection. These demonstrate posterior decompression, transpedicular hardware being placed at L4, L5, and S1, and also interbody implants at L4-L5 and L5-S1. Stable mild anterolisthesis at L4-L5. IMPRESSION: Posterior decompression and fusion depicted at L4-L5 and L5-S1. Electronically Signed   By: Genevie Ann M.D.   On: 12/27/2016 13:41    Assessment/Plan: Hemovac still quite a bit of drainage, continue for today.  Mobilize with PT in brace today.  Doing well POD 1.    LOS: 1 day    Peggyann Shoals, MD 12/28/2016, 7:23 AM

## 2016-12-29 NOTE — Progress Notes (Signed)
Patient ID: Steven Ross, male   DOB: Feb 04, 1956, 61 y.o.   MRN: [id number] Afebrile stable vital signs  Patient doing well condition of back soreness but ambulating well in the halls with physical therapy.  Neurologically nonfocal  Continue to work with therapy observed another day consider discharge tomorrow

## 2016-12-29 NOTE — Progress Notes (Signed)
Physical Therapy Treatment Patient Details Name: Steven Ross MRN: [id number] DOB: Nov 03, 1955 Today's Date: 12/29/2016    History of Present Illness Pt is a 61 y/o male s/p PLIF L4-L5 and L5-S1 secondary to spodylolisthesis. PMH including but not limited to HTN.    PT Comments    Patient seen for mobility progression. Tolerated well. Performed ambulation, bed mobility, transfers and stairs without assist. Some Cues required. Educated on care transfers and mobility expectations.    Follow Up Recommendations  No PT follow up;Supervision/Assistance - 24 hour     Equipment Recommendations  Rolling walker with 5" wheels    Recommendations for Other Services       Precautions / Restrictions Precautions Precautions: Fall;Back Precaution Comments: reveiwed with patient throughout session Required Braces or Orthoses: Spinal Brace Spinal Brace: Lumbar corset;Applied in sitting position Restrictions Weight Bearing Restrictions: No    Mobility  Bed Mobility Overal bed mobility: Needs Assistance Bed Mobility: Rolling;Sidelying to Sit Rolling: Supervision Sidelying to sit: Min guard       General bed mobility comments: increased tme and effort to perform, verbal cues for technique and positioning.  Transfers Overall transfer level: Needs assistance Equipment used: Rolling walker (2 wheeled) Transfers: Sit to/from Stand Sit to Stand: Min guard         General transfer comment: increased time and effort, VCs for hand placement   Ambulation/Gait Ambulation/Gait assistance: Min guard Ambulation Distance (Feet): 160 Feet Assistive device: Rolling walker (2 wheeled) Gait Pattern/deviations: Step-through pattern;Decreased step length - right;Decreased step length - left;Decreased stride length Gait velocity: decreased Gait velocity interpretation: Below normal speed for age/gender General Gait Details: VCs for increased gait speed, no physical assist required. Noted decreased  dorsiflexion LLE   Stairs Stairs: Yes   Stair Management: Step to pattern;Forwards Number of Stairs: 4 General stair comments: performed 4 steps up/down x2 with cues for technique. Min guard for Education officer, community    Modified Rankin (Stroke Patients Only)       Balance Overall balance assessment: Needs assistance Sitting-balance support: Feet supported Sitting balance-Leahy Scale: Good     Standing balance support: During functional activity;Bilateral upper extremity supported Standing balance-Leahy Scale: Poor Standing balance comment: reliant on bilateral UEs on RW                            Cognition Arousal/Alertness: Awake/alert Behavior During Therapy: WFL for tasks assessed/performed Overall Cognitive Status: Within Functional Limits for tasks assessed                                 General Comments: cognition was not formally assessed but [facility] for general conversation      Exercises      General Comments General comments (skin integrity, edema, etc.): educated on car transfers and mobility expectations      Pertinent Vitals/Pain Pain Assessment: 0-10 Pain Score: 3  Pain Location: back  Pain Descriptors / Indicators: Sore Pain Intervention(s): Monitored during session    Home Living                      Prior Function            PT Goals (current goals can now be found in the care plan section) Acute Rehab PT Goals Patient Stated Goal: decrease pain PT Goal Formulation: With patient Time For Goal Achievement: 01/11/17 Potential to  Achieve Goals: Good Progress towards PT goals: Progressing toward goals    Frequency    Min 5X/week      PT Plan Current plan remains appropriate    Co-evaluation              AM-PAC PT "6 Clicks" Daily Activity  Outcome Measure  Difficulty turning over in bed (including adjusting bedclothes, sheets and blankets)?: A Lot Difficulty moving from lying on  back to sitting on the side of the bed? : Unable Difficulty sitting down on and standing up from a chair with arms (e.g., wheelchair, bedside commode, etc,.)?: A Lot Help needed moving to and from a bed to chair (including a wheelchair)?: A Little Help needed walking in hospital room?: A Little Help needed climbing 3-5 steps with a railing? : A Little 6 Click Score: 14    End of Session Equipment Utilized During Treatment: Gait belt;Back brace Activity Tolerance: Patient tolerated treatment well Patient left: in chair;with call bell/phone within reach;with family/visitor present Nurse Communication: Mobility status PT Visit Diagnosis: Other abnormalities of gait and mobility (R26.89)     Time: 1962-2297 PT Time Calculation (min) (ACUTE ONLY): 20 min  Charges:  $Gait Training: 8-22 mins                    G Codes:       Alben Deeds, PT DPT  Board Certified Neurologic Specialist [place] 12/29/2016, 9:42 AM

## 2016-12-30 MED ORDER — OXYCODONE-ACETAMINOPHEN 7.5-325 MG PO TABS: 1.0000 | ORAL_TABLET | ORAL | 0 refills | Status: DC | PRN

## 2016-12-30 MED ORDER — METHOCARBAMOL 750 MG PO TABS: 750.0000 mg | ORAL_TABLET | Freq: Four times a day (QID) | ORAL | 3 refills | Status: DC

## 2016-12-30 MED FILL — Gelatin Absorbable MT Powder: OROMUCOSAL | Qty: 1 | Status: AC

## 2016-12-30 MED FILL — Thrombin For Soln 5000 Unit: CUTANEOUS | Qty: 5000 | Status: AC

## 2016-12-30 NOTE — Care Management Note (Signed)
Case Management Note  Patient Details  Name: Steven Ross MRN: [id number] Date of Birth: [birth year]  Subjective/Objective:  Pt s/p lumbar surgery. He is from home with his spouse.                   Action/Plan: Pt with orders for rolling walker. Jermaine with [facility] DME notified and delivered the equipment to the room.  No f/u per PT/OT.  Pts spouse to provide transportation home.   Expected Discharge Date:  12/30/16               Expected Discharge Plan:  Home/Self Care  In-House Referral:     Discharge planning Services  CM Consult  Post Acute Care Choice:  Durable Medical Equipment Choice offered to:  Patient  DME Arranged:  [name] rolling DME Agency:  [place]:    [place]:     Status of Service:  Completed, signed off  If discussed at [place] of Stay Meetings, dates discussed:    Additional Comments:  Pollie Friar, RN 12/30/2016, 11:18 AM

## 2016-12-30 NOTE — Discharge Summary (Signed)
Date of Admission: 12/27/2016  Date of Discharge: 12/30/16  Admission Diagnosis: Lumbosacral spondylolisthesis with radiculopathy  Discharge Diagnosis: Same  Procedure Performed: Lumbar four-five Transpsoas lumbar interbody fusion with Lumbar five-Sacrum one  Posterior lumbar interbody fusion, Lumbar four-five  Gill procedure, Lumbar five-Sacrum one Laminectomy for decompression, Lumbar four-Sacrum one cortical trajectory screw fixation and fusion with Mazor  Attending: Ditty, Kevan Ny, MD  Hospital Course:  The patient was admitted for the above listed operation and had an uncomplicated post-operative course.  They were discharged in stable condition.  Follow up: 3 weeks  Allergies as of 12/30/2016   No Known Allergies     Medication List    STOP taking these medications   diclofenac 75 MG EC tablet Commonly known as:  VOLTAREN   meloxicam 15 MG tablet Commonly known as:  MOBIC   oxyCODONE-acetaminophen 5-325 MG tablet Commonly known as:  PERCOCET/ROXICET Replaced by:  oxyCODONE-acetaminophen 7.5-325 MG tablet     TAKE these medications   gabapentin 300 MG capsule Commonly known as:  NEURONTIN Take 300 mg by mouth 3 (three) times daily.   lisinopril 10 MG tablet Commonly known as:  PRINIVIL,ZESTRIL Take 10 mg by mouth daily.   methocarbamol 750 MG tablet Commonly known as:  ROBAXIN-750 Take 1 tablet (750 mg total) by mouth 4 (four) times daily.   oxyCODONE-acetaminophen 7.5-325 MG tablet Commonly known as:  PERCOCET Take 1-2 tablets by mouth every 4 (four) hours as needed for severe pain. Replaces:  oxyCODONE-acetaminophen 5-325 MG tablet            Discharge Care Instructions        Start     Ordered   12/30/16 0000  Discharge instructions    Comments:  Do not lift more than 10 pounds Keep incision clean and dry   12/30/16 0850   12/30/16 0000  Diet - low sodium heart healthy     12/30/16 0850   12/30/16 0000  Increase activity slowly     12/30/16 0850   12/30/16 0000  Lifting restrictions    Comments:  Do not lift more than ten pounds   12/30/16 0850   12/30/16 0000  Call MD for:  temperature >100.4     12/30/16 0850   12/30/16 0000  Call MD for:  persistant nausea and vomiting     12/30/16 0850   12/30/16 0000  Call MD for:  severe uncontrolled pain     12/30/16 0850   12/30/16 0000  Call MD for:  redness, tenderness, or signs of infection (pain, swelling, redness, odor or green/yellow discharge around incision site)     12/30/16 0850   12/30/16 0000  Call MD for:  difficulty breathing, headache or visual disturbances     12/30/16 0850   12/30/16 0000  Call MD for:  persistant dizziness or light-headedness     12/30/16 0850   12/30/16 0000  oxyCODONE-acetaminophen (PERCOCET) 7.5-325 MG tablet  Every 4 hours PRN     12/30/16 0850   12/30/16 0000  methocarbamol (ROBAXIN-750) 750 MG tablet  4 times daily     12/30/16 0850

## 2016-12-30 NOTE — Progress Notes (Signed)
Patient ready for discharge to home; discharge instructions given and reviewed; Rx's given; patient discharged out via wheelchair wearing his lumbar corset; accompanied home by his wife.

## 2016-12-30 NOTE — Progress Notes (Signed)
Physical Therapy Treatment Patient Details Name: Steven Ross MRN: [id number] DOB: [birth year] Today's Date: 12/30/2016    History of Present Illness Pt is a 61 y/o male s/p PLIF L4-L5 and L5-S1 secondary to spodylolisthesis. PMH including but not limited to HTN.    PT Comments    Patient progressing well with mobility, ambulating with RW at supervision levels. Educated on mobility expectations and safety for d/c home.    Follow Up Recommendations  No PT follow up;Supervision/Assistance - 24 hour     Equipment Recommendations  Rolling walker with 5" wheels    Recommendations for Other Services       Precautions / Restrictions Precautions Precautions: Fall;Back Precaution Comments: reveiwed with patient throughout session Required Braces or Orthoses: Spinal Brace Spinal Brace: Lumbar corset;Applied in sitting position Restrictions Weight Bearing Restrictions: No    Mobility  Bed Mobility Overal bed mobility: Modified Independent                Transfers Overall transfer level: Modified independent Equipment used: Rolling walker (2 wheeled) Transfers: Sit to/from Stand              Ambulation/Gait Ambulation/Gait assistance: Supervision Ambulation Distance (Feet): 210 Feet Assistive device: Rolling walker (2 wheeled) Gait Pattern/deviations: Step-through pattern;Decreased step length - right;Decreased step length - left;Decreased stride length Gait velocity: decreased   General Gait Details: Steady with use of RW   Stairs            Wheelchair Mobility    Modified Rankin (Stroke Patients Only)       Balance Overall balance assessment: Needs assistance Sitting-balance support: Feet supported Sitting balance-Leahy Scale: Good     Standing balance support: During functional activity;Bilateral upper extremity supported Standing balance-Leahy Scale: Fair Standing balance comment: continues to use RW for support but able to release                              Cognition Arousal/Alertness: Awake/alert Behavior During Therapy: WFL for tasks assessed/performed Overall Cognitive Status: Within Functional Limits for tasks assessed                                        Exercises      General Comments        Pertinent Vitals/Pain Pain Assessment: 0-10 Pain Score: 2  Pain Location: back  Pain Descriptors / Indicators: Sore Pain Intervention(s): Monitored during session    Home Living                      Prior Function            PT Goals (current goals can now be found in the care plan section) Acute Rehab PT Goals Patient Stated Goal: decrease pain PT Goal Formulation: With patient Time For Goal Achievement: 01/11/17 Potential to Achieve Goals: Good Progress towards PT goals: Progressing toward goals    Frequency    Min 5X/week      PT Plan Current plan remains appropriate    Co-evaluation              AM-PAC PT "6 Clicks" Daily Activity  Outcome Measure  Difficulty turning over in bed (including adjusting bedclothes, sheets and blankets)?: A Little Difficulty moving from lying on back to sitting on the side of the bed? : A Little Difficulty sitting  down on and standing up from a chair with arms (e.g., wheelchair, bedside commode, etc,.)?: A Little Help needed moving to and from a bed to chair (including a wheelchair)?: A Little Help needed walking in hospital room?: A Little Help needed climbing 3-5 steps with a railing? : A Little 6 Click Score: 18    End of Session Equipment Utilized During Treatment: Gait belt;Back brace Activity Tolerance: Patient tolerated treatment well Patient left: in chair;with call bell/phone within reach;with family/visitor present Nurse Communication: Mobility status PT Visit Diagnosis: Other abnormalities of gait and mobility (R26.89)     Time: 8022-3361 PT Time Calculation (min) (ACUTE ONLY): 19 min  Charges:   $Gait Training: 8-22 mins                    G Codes:       Alben Deeds, PT DPT  Board Certified Neurologic Specialist [place] 12/30/2016, 10:30 AM

## 2017-01-01 ENCOUNTER — Encounter (HOSPITAL_COMMUNITY): Payer: Self-pay | Admitting: Neurological Surgery

## 2017-01-06 ENCOUNTER — Encounter (HOSPITAL_COMMUNITY): Payer: Self-pay | Admitting: Neurological Surgery

## 2017-01-14 ENCOUNTER — Encounter (HOSPITAL_COMMUNITY): Payer: Self-pay | Admitting: Neurological Surgery

## 2017-01-14 NOTE — Op Note (Signed)
12/27/2016  1:37 PM  PATIENT:  Steven Ross  61 y.o. male  PRE-OPERATIVE DIAGNOSIS:  Lumbosacral spondylosis with radiculopathy; Spondylolisthesis, L4-5  POST-OPERATIVE DIAGNOSIS: Same  PROCEDURE:   L4-5 anterior interbody fusion via transpsoas approach; L4-S1 laminectomy for decompression with L4-5 Gill procedure; L5-S1 posterior lumbar interbody fusion; L4-S1 cortical screw fixation and posterolateral fusion; use of BMP  SURGEON:  Aldean Ast, MD  ASSISTANTS: Ferne Reus, PA-C  ANESTHESIA:   General  DRAINS: medium hemovac   SPECIMEN:  None  INDICATION FOR PROCEDURE: 61 year old male with debilitating lumbar radicular pain.  He did not improve with medical management.  I recommended the above procedure. Patient understood the risks, benefits, and alternatives and potential outcomes and wished to proceed.  PROCEDURE DETAILS: The patient was brought to the operating room.  After smooth induction of general endotracheal anesthesia the patient was placed in the lateral position with the right side up and secured with tape.  Localizing views were taken with fluoroscopy.  The operative site was then prepped and draped in the usual sterile fashion.  The planned incisions were infiltrated with lidocaine with epinephrine.   The skin was sharply incised and soft tissue to the level of the oblique abdominal muscles was carried out with monopolar cautery.  I then bluntly dissected through the oblique muscles with care taken to preserve any nerves.  I encountered encountered transversalis fascia which was bluntly entered and this opened into an easily dissected fat plane consistent with the retroperitoneum.  I inserted the dilator and approached the posterior third of the L4-5 disc space.  I confirmed that I was not in contact with any nerves of the lumbar plexus. A K-wire was passed into the disc space.  The tract was sequentially dilated and then the retractor was introduced.  I incised  the disc space and then passed a Cobb elevator along each endplate and penetrated the annulus on the contralateral side.  I then used box cutters, a paddle, and a rasp to complete the discectomy.  A trial spacer was used to determine appropriate graft size and then a PEEK lordotic interbody graft packed with grafton and BMP was inserted.  The retractor was removed after hemostasis was confirmed.   The incision was closed in layers with interrupted vicryl sutures.  The skin was closed with running monocryl stratafix sutures and dermabond.  The patient was then turned prone on an open [place] table  The skin of the lumbar area was clipped of hair and wiped out with alcohol. It was prepped and draped in the usual sterile fashion. The planned incision was injected with a mixture of lidocaine and Marcaine with epinephrine.  The skin was opened sharply and a subperiosteal dissection was performed to expose the lateral edges of the lamina of L4-S1.  Subperiosteal dissection was performed over the L4-5 and L5-S1 facet joints bilaterally.    The [facility] robotic system was registered to the patient using fluoroscopy.  Using the [facility] as a drill guide cortical trajectory screw tracts were then drilled at L4, L5, and S1 bilaterally.  K-Wires were placed down the trajectories and I then tapped over the K-wires.  K-wires were removed and the trajectories were palpated with a ball tip probe and found to be competent.  I then resected the spinous processes of L4 and L5.  I used the high speed drill to drill a trough across the lamina and pars at L4 and L5.  I then used an osteotome to fracture  the bone across the pars and separate the inferior articular process at each level.  This was separated from the underlying dura and ligament and removed from the surgical field.  The underlying ligament was elevated and resected.  Decompression was carried out to the lateral recesses with Kerrison rongeurs.  The superior edge of  the exposed superior articular processes was resected to expose underlying annulus.  The thecal sac and nerve roots was separated from the posterior vertebral body wall and annulus where interbody fusion was planned.  At L5-S1 an annulotomy was made. Using sequentially larger disc shavers this space was expanded. Disc material was removed with shavers, curettes, and the bone was decorticated with a rasp. The interbody space was packed with a combination of BMP and locally harvested bone and two expandable titanium spacers were inserted into the interbody space.  Screws were then placed to the appropriate depth down the cannulated tracts.  A lordotic rod was inserted and secured in the screw caps. Final tightening with a torque wrench was performed at all levels. The remaining facet joints and transverse processes from L4-S1 were decorticated with the high speed burr.  Fusion substrate including BMP was placed in the lateral gutters.  Meticulous hemostasis was obtained. The wound was irrigated with bacitracin saline. Exparel was injected into the paraspinous muscles.  A medium Hemovac drain was placed below the fascia. About 1 g of vancomycin powder was inserted into the wound. The wound was closed in routine anatomic layers. The skin was closed with a running subcuticular monocryl suture and then sealed with dermabond.   The patient was returned to the supine position and awoke without complication.    PATIENT DISPOSITION:  PACU - hemodynamically stable.   Delay start of Pharmacological VTE agent (>24hrs) due to surgical blood loss or risk of bleeding:  yes

## 2017-01-14 NOTE — Addendum Note (Signed)
Addendum  created 01/14/17 1000 by Murvin Natal, MD   Anesthesia Event edited, Anesthesia Staff edited

## 2017-01-16 DIAGNOSIS — M4317 Spondylolisthesis, lumbosacral region: Secondary | ICD-10-CM | POA: Diagnosis not present

## 2017-01-24 DIAGNOSIS — R262 Difficulty in walking, not elsewhere classified: Secondary | ICD-10-CM | POA: Diagnosis not present

## 2017-01-24 DIAGNOSIS — M6281 Muscle weakness (generalized): Secondary | ICD-10-CM | POA: Diagnosis not present

## 2017-01-24 DIAGNOSIS — M545 Low back pain: Secondary | ICD-10-CM | POA: Diagnosis not present

## 2017-01-24 DIAGNOSIS — M539 Dorsopathy, unspecified: Secondary | ICD-10-CM | POA: Diagnosis not present

## 2017-01-27 DIAGNOSIS — R262 Difficulty in walking, not elsewhere classified: Secondary | ICD-10-CM | POA: Diagnosis not present

## 2017-01-27 DIAGNOSIS — M545 Low back pain: Secondary | ICD-10-CM | POA: Diagnosis not present

## 2017-01-27 DIAGNOSIS — M6281 Muscle weakness (generalized): Secondary | ICD-10-CM | POA: Diagnosis not present

## 2017-01-27 DIAGNOSIS — M539 Dorsopathy, unspecified: Secondary | ICD-10-CM | POA: Diagnosis not present

## 2017-01-29 DIAGNOSIS — M539 Dorsopathy, unspecified: Secondary | ICD-10-CM | POA: Diagnosis not present

## 2017-01-29 DIAGNOSIS — M545 Low back pain: Secondary | ICD-10-CM | POA: Diagnosis not present

## 2017-01-29 DIAGNOSIS — R262 Difficulty in walking, not elsewhere classified: Secondary | ICD-10-CM | POA: Diagnosis not present

## 2017-01-29 DIAGNOSIS — M6281 Muscle weakness (generalized): Secondary | ICD-10-CM | POA: Diagnosis not present

## 2017-02-03 DIAGNOSIS — M6281 Muscle weakness (generalized): Secondary | ICD-10-CM | POA: Diagnosis not present

## 2017-02-03 DIAGNOSIS — R262 Difficulty in walking, not elsewhere classified: Secondary | ICD-10-CM | POA: Diagnosis not present

## 2017-02-03 DIAGNOSIS — M545 Low back pain: Secondary | ICD-10-CM | POA: Diagnosis not present

## 2017-02-03 DIAGNOSIS — M539 Dorsopathy, unspecified: Secondary | ICD-10-CM | POA: Diagnosis not present

## 2017-02-06 DIAGNOSIS — R262 Difficulty in walking, not elsewhere classified: Secondary | ICD-10-CM | POA: Diagnosis not present

## 2017-02-06 DIAGNOSIS — M545 Low back pain: Secondary | ICD-10-CM | POA: Diagnosis not present

## 2017-02-06 DIAGNOSIS — M6281 Muscle weakness (generalized): Secondary | ICD-10-CM | POA: Diagnosis not present

## 2017-02-06 DIAGNOSIS — M539 Dorsopathy, unspecified: Secondary | ICD-10-CM | POA: Diagnosis not present

## 2017-02-12 DIAGNOSIS — R262 Difficulty in walking, not elsewhere classified: Secondary | ICD-10-CM | POA: Diagnosis not present

## 2017-02-12 DIAGNOSIS — M6281 Muscle weakness (generalized): Secondary | ICD-10-CM | POA: Diagnosis not present

## 2017-02-12 DIAGNOSIS — M545 Low back pain: Secondary | ICD-10-CM | POA: Diagnosis not present

## 2017-02-12 DIAGNOSIS — M539 Dorsopathy, unspecified: Secondary | ICD-10-CM | POA: Diagnosis not present

## 2017-02-14 DIAGNOSIS — R262 Difficulty in walking, not elsewhere classified: Secondary | ICD-10-CM | POA: Diagnosis not present

## 2017-02-14 DIAGNOSIS — M545 Low back pain: Secondary | ICD-10-CM | POA: Diagnosis not present

## 2017-02-14 DIAGNOSIS — M6281 Muscle weakness (generalized): Secondary | ICD-10-CM | POA: Diagnosis not present

## 2017-02-14 DIAGNOSIS — M539 Dorsopathy, unspecified: Secondary | ICD-10-CM | POA: Diagnosis not present

## 2017-02-18 DIAGNOSIS — R262 Difficulty in walking, not elsewhere classified: Secondary | ICD-10-CM | POA: Diagnosis not present

## 2017-02-18 DIAGNOSIS — M545 Low back pain: Secondary | ICD-10-CM | POA: Diagnosis not present

## 2017-02-18 DIAGNOSIS — M6281 Muscle weakness (generalized): Secondary | ICD-10-CM | POA: Diagnosis not present

## 2017-02-18 DIAGNOSIS — M539 Dorsopathy, unspecified: Secondary | ICD-10-CM | POA: Diagnosis not present

## 2017-02-20 DIAGNOSIS — M6281 Muscle weakness (generalized): Secondary | ICD-10-CM | POA: Diagnosis not present

## 2017-02-20 DIAGNOSIS — M539 Dorsopathy, unspecified: Secondary | ICD-10-CM | POA: Diagnosis not present

## 2017-02-20 DIAGNOSIS — R262 Difficulty in walking, not elsewhere classified: Secondary | ICD-10-CM | POA: Diagnosis not present

## 2017-02-20 DIAGNOSIS — M545 Low back pain: Secondary | ICD-10-CM | POA: Diagnosis not present

## 2017-02-24 DIAGNOSIS — M6281 Muscle weakness (generalized): Secondary | ICD-10-CM | POA: Diagnosis not present

## 2017-02-24 DIAGNOSIS — R262 Difficulty in walking, not elsewhere classified: Secondary | ICD-10-CM | POA: Diagnosis not present

## 2017-02-24 DIAGNOSIS — M539 Dorsopathy, unspecified: Secondary | ICD-10-CM | POA: Diagnosis not present

## 2017-02-24 DIAGNOSIS — M545 Low back pain: Secondary | ICD-10-CM | POA: Diagnosis not present

## 2017-02-26 DIAGNOSIS — M6281 Muscle weakness (generalized): Secondary | ICD-10-CM | POA: Diagnosis not present

## 2017-02-26 DIAGNOSIS — M545 Low back pain: Secondary | ICD-10-CM | POA: Diagnosis not present

## 2017-02-26 DIAGNOSIS — R262 Difficulty in walking, not elsewhere classified: Secondary | ICD-10-CM | POA: Diagnosis not present

## 2017-02-26 DIAGNOSIS — M539 Dorsopathy, unspecified: Secondary | ICD-10-CM | POA: Diagnosis not present

## 2017-03-03 DIAGNOSIS — M539 Dorsopathy, unspecified: Secondary | ICD-10-CM | POA: Diagnosis not present

## 2017-03-03 DIAGNOSIS — M6281 Muscle weakness (generalized): Secondary | ICD-10-CM | POA: Diagnosis not present

## 2017-03-03 DIAGNOSIS — M545 Low back pain: Secondary | ICD-10-CM | POA: Diagnosis not present

## 2017-03-03 DIAGNOSIS — R262 Difficulty in walking, not elsewhere classified: Secondary | ICD-10-CM | POA: Diagnosis not present

## 2017-03-07 DIAGNOSIS — R262 Difficulty in walking, not elsewhere classified: Secondary | ICD-10-CM | POA: Diagnosis not present

## 2017-03-07 DIAGNOSIS — M6281 Muscle weakness (generalized): Secondary | ICD-10-CM | POA: Diagnosis not present

## 2017-03-07 DIAGNOSIS — M539 Dorsopathy, unspecified: Secondary | ICD-10-CM | POA: Diagnosis not present

## 2017-03-07 DIAGNOSIS — M545 Low back pain: Secondary | ICD-10-CM | POA: Diagnosis not present

## 2017-03-10 DIAGNOSIS — M6281 Muscle weakness (generalized): Secondary | ICD-10-CM | POA: Diagnosis not present

## 2017-03-10 DIAGNOSIS — R262 Difficulty in walking, not elsewhere classified: Secondary | ICD-10-CM | POA: Diagnosis not present

## 2017-03-10 DIAGNOSIS — M539 Dorsopathy, unspecified: Secondary | ICD-10-CM | POA: Diagnosis not present

## 2017-03-10 DIAGNOSIS — M545 Low back pain: Secondary | ICD-10-CM | POA: Diagnosis not present

## 2017-03-12 DIAGNOSIS — R262 Difficulty in walking, not elsewhere classified: Secondary | ICD-10-CM | POA: Diagnosis not present

## 2017-03-12 DIAGNOSIS — M6281 Muscle weakness (generalized): Secondary | ICD-10-CM | POA: Diagnosis not present

## 2017-03-12 DIAGNOSIS — M545 Low back pain: Secondary | ICD-10-CM | POA: Diagnosis not present

## 2017-03-12 DIAGNOSIS — M539 Dorsopathy, unspecified: Secondary | ICD-10-CM | POA: Diagnosis not present

## 2017-03-17 DIAGNOSIS — M6281 Muscle weakness (generalized): Secondary | ICD-10-CM | POA: Diagnosis not present

## 2017-03-17 DIAGNOSIS — M539 Dorsopathy, unspecified: Secondary | ICD-10-CM | POA: Diagnosis not present

## 2017-03-17 DIAGNOSIS — M545 Low back pain: Secondary | ICD-10-CM | POA: Diagnosis not present

## 2017-03-17 DIAGNOSIS — R262 Difficulty in walking, not elsewhere classified: Secondary | ICD-10-CM | POA: Diagnosis not present

## 2017-03-20 DIAGNOSIS — M4317 Spondylolisthesis, lumbosacral region: Secondary | ICD-10-CM | POA: Diagnosis not present

## 2017-03-21 DIAGNOSIS — M6281 Muscle weakness (generalized): Secondary | ICD-10-CM | POA: Diagnosis not present

## 2017-03-21 DIAGNOSIS — M545 Low back pain: Secondary | ICD-10-CM | POA: Diagnosis not present

## 2017-03-21 DIAGNOSIS — M539 Dorsopathy, unspecified: Secondary | ICD-10-CM | POA: Diagnosis not present

## 2017-03-21 DIAGNOSIS — R262 Difficulty in walking, not elsewhere classified: Secondary | ICD-10-CM | POA: Diagnosis not present

## 2017-03-25 DIAGNOSIS — R262 Difficulty in walking, not elsewhere classified: Secondary | ICD-10-CM | POA: Diagnosis not present

## 2017-03-25 DIAGNOSIS — M539 Dorsopathy, unspecified: Secondary | ICD-10-CM | POA: Diagnosis not present

## 2017-03-25 DIAGNOSIS — M6281 Muscle weakness (generalized): Secondary | ICD-10-CM | POA: Diagnosis not present

## 2017-03-25 DIAGNOSIS — M545 Low back pain: Secondary | ICD-10-CM | POA: Diagnosis not present

## 2017-03-27 DIAGNOSIS — M545 Low back pain: Secondary | ICD-10-CM | POA: Diagnosis not present

## 2017-03-27 DIAGNOSIS — M539 Dorsopathy, unspecified: Secondary | ICD-10-CM | POA: Diagnosis not present

## 2017-03-27 DIAGNOSIS — R262 Difficulty in walking, not elsewhere classified: Secondary | ICD-10-CM | POA: Diagnosis not present

## 2017-03-27 DIAGNOSIS — M6281 Muscle weakness (generalized): Secondary | ICD-10-CM | POA: Diagnosis not present

## 2017-04-01 DIAGNOSIS — M539 Dorsopathy, unspecified: Secondary | ICD-10-CM | POA: Diagnosis not present

## 2017-04-01 DIAGNOSIS — M6281 Muscle weakness (generalized): Secondary | ICD-10-CM | POA: Diagnosis not present

## 2017-04-01 DIAGNOSIS — R262 Difficulty in walking, not elsewhere classified: Secondary | ICD-10-CM | POA: Diagnosis not present

## 2017-04-01 DIAGNOSIS — M545 Low back pain: Secondary | ICD-10-CM | POA: Diagnosis not present

## 2017-04-03 DIAGNOSIS — R262 Difficulty in walking, not elsewhere classified: Secondary | ICD-10-CM | POA: Diagnosis not present

## 2017-04-03 DIAGNOSIS — M6281 Muscle weakness (generalized): Secondary | ICD-10-CM | POA: Diagnosis not present

## 2017-04-03 DIAGNOSIS — M539 Dorsopathy, unspecified: Secondary | ICD-10-CM | POA: Diagnosis not present

## 2017-04-03 DIAGNOSIS — M545 Low back pain: Secondary | ICD-10-CM | POA: Diagnosis not present

## 2017-04-08 DIAGNOSIS — M539 Dorsopathy, unspecified: Secondary | ICD-10-CM | POA: Diagnosis not present

## 2017-04-08 DIAGNOSIS — R262 Difficulty in walking, not elsewhere classified: Secondary | ICD-10-CM | POA: Diagnosis not present

## 2017-04-08 DIAGNOSIS — M545 Low back pain: Secondary | ICD-10-CM | POA: Diagnosis not present

## 2017-04-08 DIAGNOSIS — M6281 Muscle weakness (generalized): Secondary | ICD-10-CM | POA: Diagnosis not present

## 2017-04-10 DIAGNOSIS — M6281 Muscle weakness (generalized): Secondary | ICD-10-CM | POA: Diagnosis not present

## 2017-04-10 DIAGNOSIS — M539 Dorsopathy, unspecified: Secondary | ICD-10-CM | POA: Diagnosis not present

## 2017-04-10 DIAGNOSIS — R262 Difficulty in walking, not elsewhere classified: Secondary | ICD-10-CM | POA: Diagnosis not present

## 2017-04-10 DIAGNOSIS — M545 Low back pain: Secondary | ICD-10-CM | POA: Diagnosis not present

## 2017-04-14 DIAGNOSIS — R262 Difficulty in walking, not elsewhere classified: Secondary | ICD-10-CM | POA: Diagnosis not present

## 2017-04-14 DIAGNOSIS — M539 Dorsopathy, unspecified: Secondary | ICD-10-CM | POA: Diagnosis not present

## 2017-04-14 DIAGNOSIS — M6281 Muscle weakness (generalized): Secondary | ICD-10-CM | POA: Diagnosis not present

## 2017-04-14 DIAGNOSIS — M545 Low back pain: Secondary | ICD-10-CM | POA: Diagnosis not present

## 2017-04-17 DIAGNOSIS — M539 Dorsopathy, unspecified: Secondary | ICD-10-CM | POA: Diagnosis not present

## 2017-04-17 DIAGNOSIS — M6281 Muscle weakness (generalized): Secondary | ICD-10-CM | POA: Diagnosis not present

## 2017-04-17 DIAGNOSIS — R262 Difficulty in walking, not elsewhere classified: Secondary | ICD-10-CM | POA: Diagnosis not present

## 2017-04-17 DIAGNOSIS — M545 Low back pain: Secondary | ICD-10-CM | POA: Diagnosis not present

## 2017-04-21 DIAGNOSIS — M545 Low back pain: Secondary | ICD-10-CM | POA: Diagnosis not present

## 2017-04-21 DIAGNOSIS — M539 Dorsopathy, unspecified: Secondary | ICD-10-CM | POA: Diagnosis not present

## 2017-04-21 DIAGNOSIS — R262 Difficulty in walking, not elsewhere classified: Secondary | ICD-10-CM | POA: Diagnosis not present

## 2017-04-21 DIAGNOSIS — M6281 Muscle weakness (generalized): Secondary | ICD-10-CM | POA: Diagnosis not present

## 2017-04-24 DIAGNOSIS — M545 Low back pain: Secondary | ICD-10-CM | POA: Diagnosis not present

## 2017-04-24 DIAGNOSIS — M6281 Muscle weakness (generalized): Secondary | ICD-10-CM | POA: Diagnosis not present

## 2017-04-24 DIAGNOSIS — R262 Difficulty in walking, not elsewhere classified: Secondary | ICD-10-CM | POA: Diagnosis not present

## 2017-04-24 DIAGNOSIS — M539 Dorsopathy, unspecified: Secondary | ICD-10-CM | POA: Diagnosis not present

## 2017-04-29 DIAGNOSIS — M545 Low back pain: Secondary | ICD-10-CM | POA: Diagnosis not present

## 2017-04-29 DIAGNOSIS — M539 Dorsopathy, unspecified: Secondary | ICD-10-CM | POA: Diagnosis not present

## 2017-04-29 DIAGNOSIS — R262 Difficulty in walking, not elsewhere classified: Secondary | ICD-10-CM | POA: Diagnosis not present

## 2017-04-29 DIAGNOSIS — M6281 Muscle weakness (generalized): Secondary | ICD-10-CM | POA: Diagnosis not present

## 2017-05-01 DIAGNOSIS — M545 Low back pain: Secondary | ICD-10-CM | POA: Diagnosis not present

## 2017-05-01 DIAGNOSIS — M6281 Muscle weakness (generalized): Secondary | ICD-10-CM | POA: Diagnosis not present

## 2017-05-01 DIAGNOSIS — M539 Dorsopathy, unspecified: Secondary | ICD-10-CM | POA: Diagnosis not present

## 2017-05-01 DIAGNOSIS — R262 Difficulty in walking, not elsewhere classified: Secondary | ICD-10-CM | POA: Diagnosis not present

## 2017-05-06 DIAGNOSIS — M6281 Muscle weakness (generalized): Secondary | ICD-10-CM | POA: Diagnosis not present

## 2017-05-06 DIAGNOSIS — R262 Difficulty in walking, not elsewhere classified: Secondary | ICD-10-CM | POA: Diagnosis not present

## 2017-05-06 DIAGNOSIS — M539 Dorsopathy, unspecified: Secondary | ICD-10-CM | POA: Diagnosis not present

## 2017-05-06 DIAGNOSIS — M545 Low back pain: Secondary | ICD-10-CM | POA: Diagnosis not present

## 2017-05-08 DIAGNOSIS — M545 Low back pain: Secondary | ICD-10-CM | POA: Diagnosis not present

## 2017-05-08 DIAGNOSIS — M6281 Muscle weakness (generalized): Secondary | ICD-10-CM | POA: Diagnosis not present

## 2017-05-08 DIAGNOSIS — R262 Difficulty in walking, not elsewhere classified: Secondary | ICD-10-CM | POA: Diagnosis not present

## 2017-05-08 DIAGNOSIS — M539 Dorsopathy, unspecified: Secondary | ICD-10-CM | POA: Diagnosis not present

## 2017-05-15 DIAGNOSIS — M545 Low back pain: Secondary | ICD-10-CM | POA: Diagnosis not present

## 2017-05-15 DIAGNOSIS — M6281 Muscle weakness (generalized): Secondary | ICD-10-CM | POA: Diagnosis not present

## 2017-05-15 DIAGNOSIS — M539 Dorsopathy, unspecified: Secondary | ICD-10-CM | POA: Diagnosis not present

## 2017-05-15 DIAGNOSIS — R262 Difficulty in walking, not elsewhere classified: Secondary | ICD-10-CM | POA: Diagnosis not present

## 2017-05-19 DIAGNOSIS — M545 Low back pain: Secondary | ICD-10-CM | POA: Diagnosis not present

## 2017-05-19 DIAGNOSIS — M6281 Muscle weakness (generalized): Secondary | ICD-10-CM | POA: Diagnosis not present

## 2017-05-19 DIAGNOSIS — M539 Dorsopathy, unspecified: Secondary | ICD-10-CM | POA: Diagnosis not present

## 2017-05-19 DIAGNOSIS — R262 Difficulty in walking, not elsewhere classified: Secondary | ICD-10-CM | POA: Diagnosis not present

## 2017-05-23 DIAGNOSIS — R262 Difficulty in walking, not elsewhere classified: Secondary | ICD-10-CM | POA: Diagnosis not present

## 2017-05-23 DIAGNOSIS — M6281 Muscle weakness (generalized): Secondary | ICD-10-CM | POA: Diagnosis not present

## 2017-05-23 DIAGNOSIS — M539 Dorsopathy, unspecified: Secondary | ICD-10-CM | POA: Diagnosis not present

## 2017-05-23 DIAGNOSIS — M545 Low back pain: Secondary | ICD-10-CM | POA: Diagnosis not present

## 2017-05-27 DIAGNOSIS — M6281 Muscle weakness (generalized): Secondary | ICD-10-CM | POA: Diagnosis not present

## 2017-05-27 DIAGNOSIS — R262 Difficulty in walking, not elsewhere classified: Secondary | ICD-10-CM | POA: Diagnosis not present

## 2017-05-27 DIAGNOSIS — M539 Dorsopathy, unspecified: Secondary | ICD-10-CM | POA: Diagnosis not present

## 2017-05-27 DIAGNOSIS — M545 Low back pain: Secondary | ICD-10-CM | POA: Diagnosis not present

## 2017-05-29 DIAGNOSIS — M545 Low back pain: Secondary | ICD-10-CM | POA: Diagnosis not present

## 2017-05-29 DIAGNOSIS — M539 Dorsopathy, unspecified: Secondary | ICD-10-CM | POA: Diagnosis not present

## 2017-05-29 DIAGNOSIS — R262 Difficulty in walking, not elsewhere classified: Secondary | ICD-10-CM | POA: Diagnosis not present

## 2017-05-29 DIAGNOSIS — M6281 Muscle weakness (generalized): Secondary | ICD-10-CM | POA: Diagnosis not present

## 2017-06-02 DIAGNOSIS — M6281 Muscle weakness (generalized): Secondary | ICD-10-CM | POA: Diagnosis not present

## 2017-06-02 DIAGNOSIS — M539 Dorsopathy, unspecified: Secondary | ICD-10-CM | POA: Diagnosis not present

## 2017-06-02 DIAGNOSIS — R262 Difficulty in walking, not elsewhere classified: Secondary | ICD-10-CM | POA: Diagnosis not present

## 2017-06-02 DIAGNOSIS — M545 Low back pain: Secondary | ICD-10-CM | POA: Diagnosis not present

## 2017-06-05 DIAGNOSIS — R262 Difficulty in walking, not elsewhere classified: Secondary | ICD-10-CM | POA: Diagnosis not present

## 2017-06-05 DIAGNOSIS — M539 Dorsopathy, unspecified: Secondary | ICD-10-CM | POA: Diagnosis not present

## 2017-06-05 DIAGNOSIS — M545 Low back pain: Secondary | ICD-10-CM | POA: Diagnosis not present

## 2017-06-05 DIAGNOSIS — M6281 Muscle weakness (generalized): Secondary | ICD-10-CM | POA: Diagnosis not present

## 2017-06-09 DIAGNOSIS — M6281 Muscle weakness (generalized): Secondary | ICD-10-CM | POA: Diagnosis not present

## 2017-06-09 DIAGNOSIS — M545 Low back pain: Secondary | ICD-10-CM | POA: Diagnosis not present

## 2017-06-09 DIAGNOSIS — M539 Dorsopathy, unspecified: Secondary | ICD-10-CM | POA: Diagnosis not present

## 2017-06-09 DIAGNOSIS — R262 Difficulty in walking, not elsewhere classified: Secondary | ICD-10-CM | POA: Diagnosis not present

## 2017-06-11 DIAGNOSIS — M6281 Muscle weakness (generalized): Secondary | ICD-10-CM | POA: Diagnosis not present

## 2017-06-11 DIAGNOSIS — M539 Dorsopathy, unspecified: Secondary | ICD-10-CM | POA: Diagnosis not present

## 2017-06-11 DIAGNOSIS — M545 Low back pain: Secondary | ICD-10-CM | POA: Diagnosis not present

## 2017-06-11 DIAGNOSIS — R262 Difficulty in walking, not elsewhere classified: Secondary | ICD-10-CM | POA: Diagnosis not present

## 2017-08-22 DIAGNOSIS — M5136 Other intervertebral disc degeneration, lumbar region: Secondary | ICD-10-CM | POA: Diagnosis not present

## 2017-08-22 DIAGNOSIS — Z981 Arthrodesis status: Secondary | ICD-10-CM | POA: Diagnosis not present

## 2017-08-22 DIAGNOSIS — M47816 Spondylosis without myelopathy or radiculopathy, lumbar region: Secondary | ICD-10-CM | POA: Diagnosis not present

## 2017-08-22 DIAGNOSIS — M4317 Spondylolisthesis, lumbosacral region: Secondary | ICD-10-CM | POA: Diagnosis not present

## 2017-08-22 DIAGNOSIS — Z6833 Body mass index (BMI) 33.0-33.9, adult: Secondary | ICD-10-CM | POA: Diagnosis not present

## 2017-08-22 DIAGNOSIS — I10 Essential (primary) hypertension: Secondary | ICD-10-CM | POA: Diagnosis not present

## 2018-02-24 DIAGNOSIS — M47816 Spondylosis without myelopathy or radiculopathy, lumbar region: Secondary | ICD-10-CM | POA: Diagnosis not present

## 2018-02-24 DIAGNOSIS — Z981 Arthrodesis status: Secondary | ICD-10-CM | POA: Diagnosis not present

## 2018-02-24 DIAGNOSIS — I10 Essential (primary) hypertension: Secondary | ICD-10-CM | POA: Diagnosis not present

## 2018-02-24 DIAGNOSIS — Z6833 Body mass index (BMI) 33.0-33.9, adult: Secondary | ICD-10-CM | POA: Diagnosis not present

## 2018-02-24 DIAGNOSIS — M5136 Other intervertebral disc degeneration, lumbar region: Secondary | ICD-10-CM | POA: Diagnosis not present

## 2018-02-26 DIAGNOSIS — L089 Local infection of the skin and subcutaneous tissue, unspecified: Secondary | ICD-10-CM | POA: Diagnosis not present

## 2018-02-26 DIAGNOSIS — L723 Sebaceous cyst: Secondary | ICD-10-CM | POA: Diagnosis not present

## 2018-03-11 DIAGNOSIS — Z131 Encounter for screening for diabetes mellitus: Secondary | ICD-10-CM | POA: Diagnosis not present

## 2018-03-11 DIAGNOSIS — Z125 Encounter for screening for malignant neoplasm of prostate: Secondary | ICD-10-CM | POA: Diagnosis not present

## 2018-03-11 DIAGNOSIS — Z1389 Encounter for screening for other disorder: Secondary | ICD-10-CM | POA: Diagnosis not present

## 2018-03-11 DIAGNOSIS — Z136 Encounter for screening for cardiovascular disorders: Secondary | ICD-10-CM | POA: Diagnosis not present

## 2018-03-11 DIAGNOSIS — I10 Essential (primary) hypertension: Secondary | ICD-10-CM | POA: Diagnosis not present

## 2018-03-12 DIAGNOSIS — L089 Local infection of the skin and subcutaneous tissue, unspecified: Secondary | ICD-10-CM | POA: Diagnosis not present

## 2018-03-12 DIAGNOSIS — L723 Sebaceous cyst: Secondary | ICD-10-CM | POA: Diagnosis not present

## 2018-04-08 DIAGNOSIS — I10 Essential (primary) hypertension: Secondary | ICD-10-CM | POA: Diagnosis not present

## 2018-04-08 DIAGNOSIS — Q829 Congenital malformation of skin, unspecified: Secondary | ICD-10-CM | POA: Diagnosis not present

## 2018-04-08 DIAGNOSIS — E782 Mixed hyperlipidemia: Secondary | ICD-10-CM | POA: Diagnosis not present

## 2018-04-08 DIAGNOSIS — E669 Obesity, unspecified: Secondary | ICD-10-CM | POA: Diagnosis not present

## 2018-04-23 DIAGNOSIS — L720 Epidermal cyst: Secondary | ICD-10-CM | POA: Diagnosis not present

## 2018-05-02 IMAGING — CT CT L SPINE W/O CM
3 of 4 series · 10 of 33 positions shown, 12 images · non-contrast
Comparison: None.

CLINICAL DATA: 61 y/o M; many years of radiculopathy and back pain.

EXAM:
CT LUMBAR SPINE WITHOUT CONTRAST
TECHNIQUE: Multidetector CT imaging of the lumbar spine was performed without
intravenous contrast administration. Multiplanar CT image
reconstructions were also generated.

[Series 6: l-spine adult 2.0 · axial · 0.59mm/px · z∈[+922,+1084]mm · 4 of 117 slices shown, 5 images]
[im 18/117  soft-tissue]
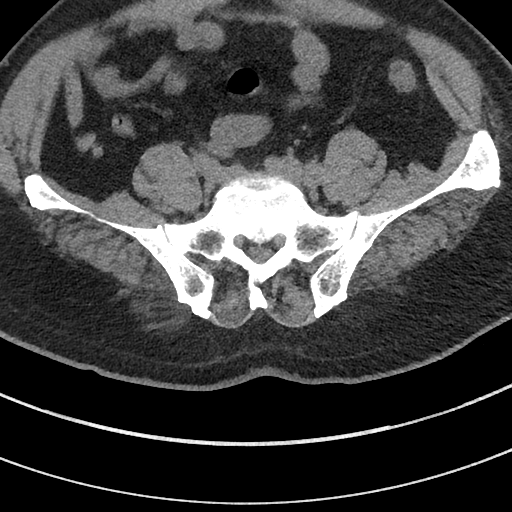
[im 18/117  bone]
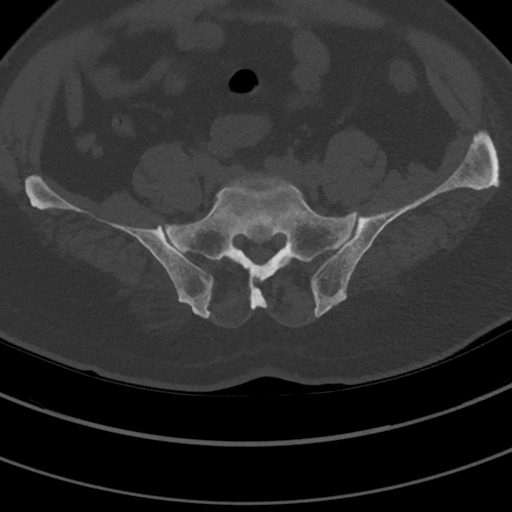
[im 45/117  bone]
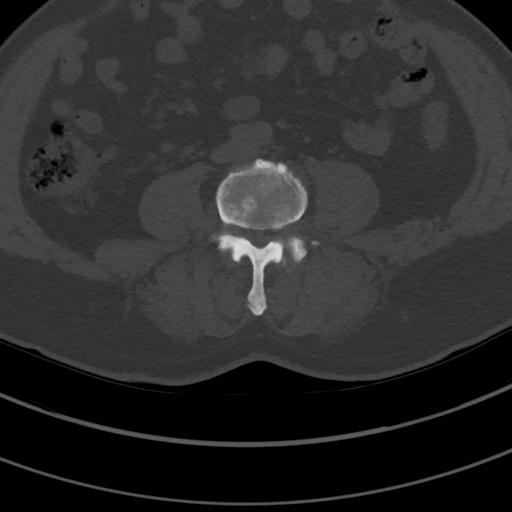
[im 72/117  bone]
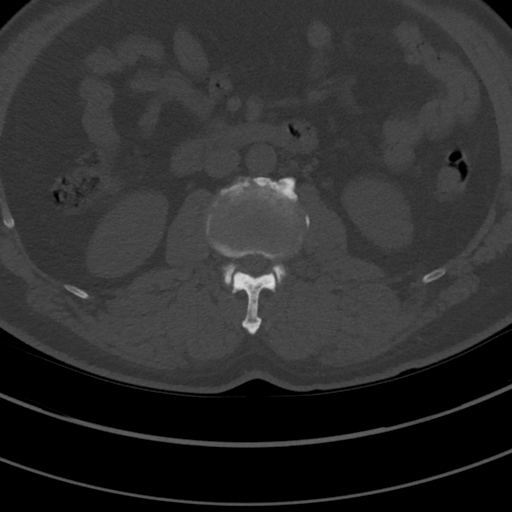
[im 99/117  bone]
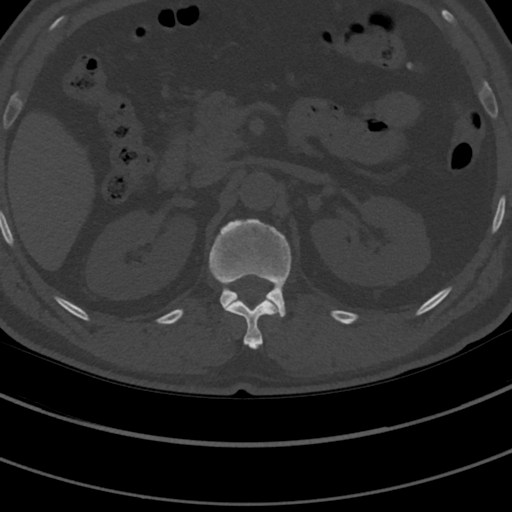

[Series 7: cor bone · coronal · 0.35mm/px · 1 of 61 slices shown]
[im 31/61  bone]
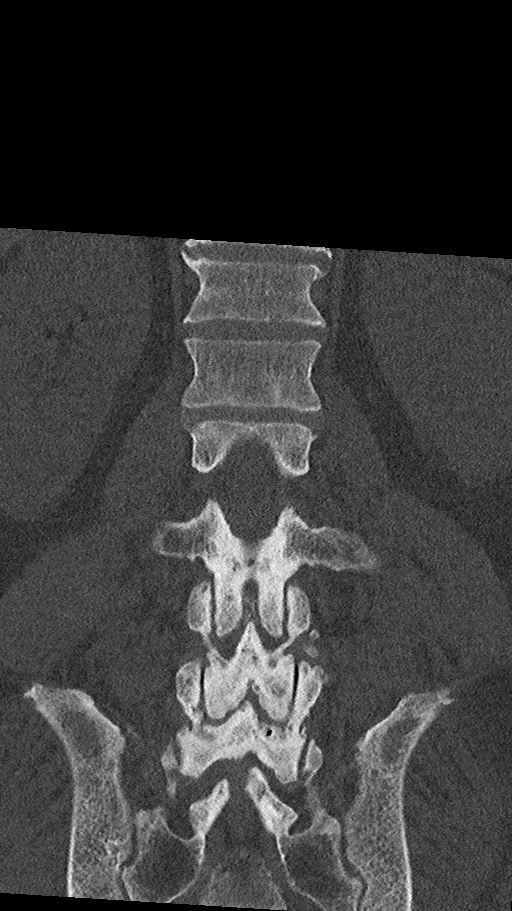

[Series 10: sagittal st · sagittal · 0.30mm/px · 5 of 61 slices shown, 6 images]
[im 21/61  bone]
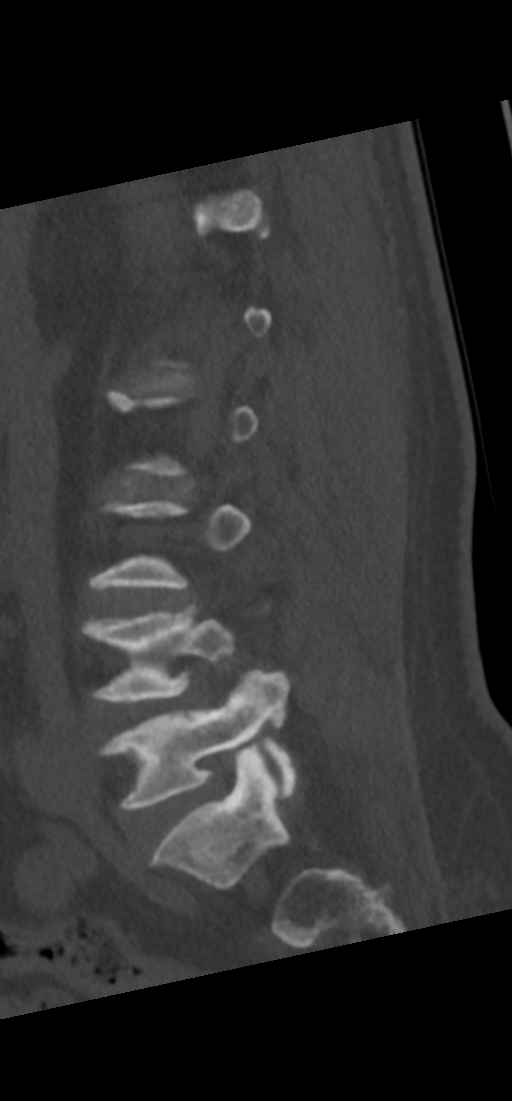
[im 26/61  bone]
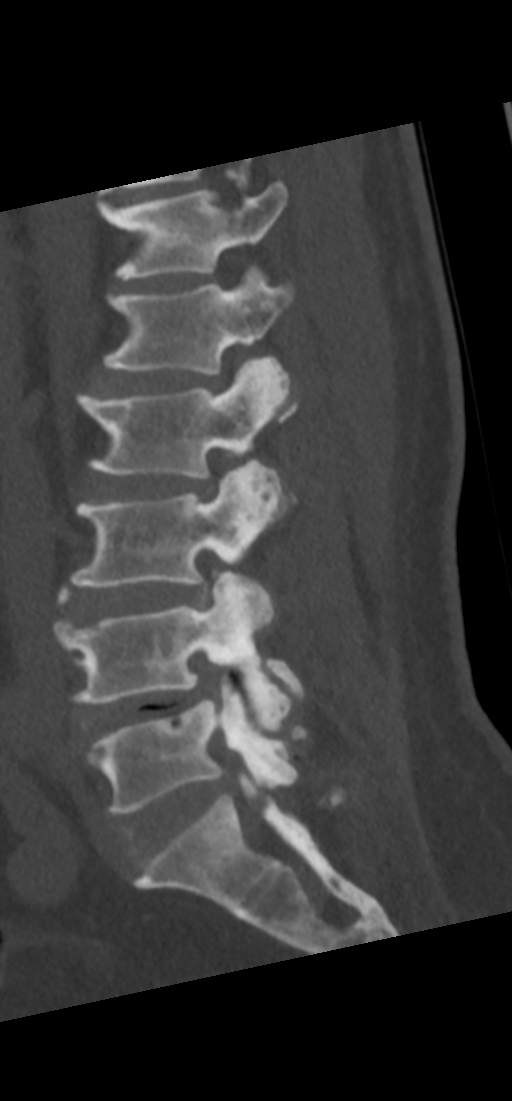
[im 31/61  soft-tissue]
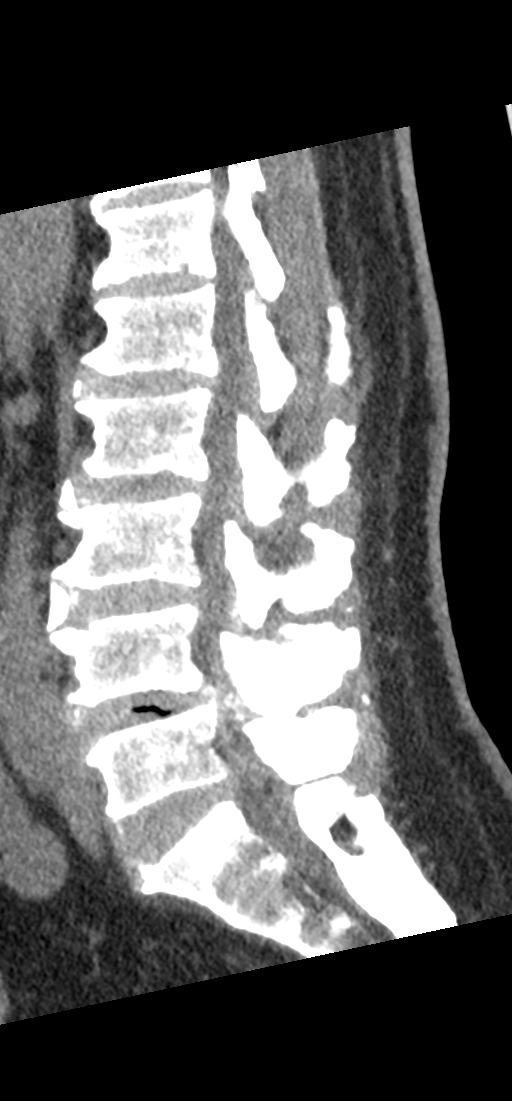
[im 31/61  bone]
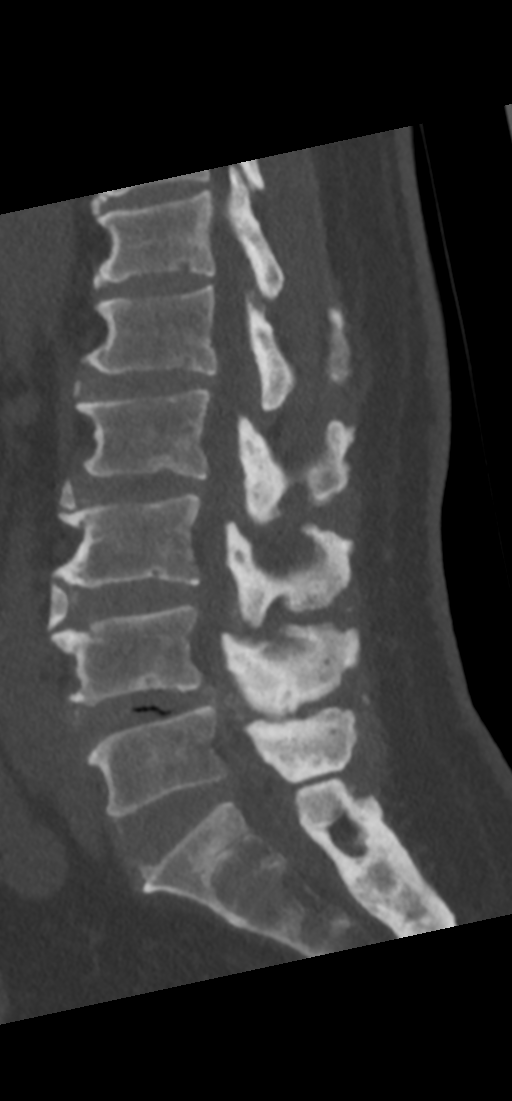
[im 36/61  bone]
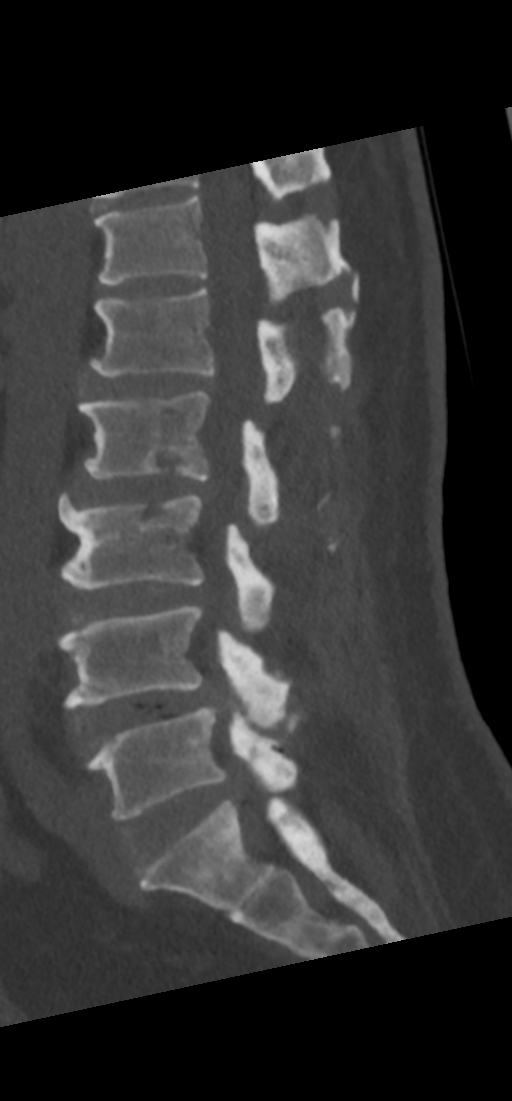
[im 41/61  bone]
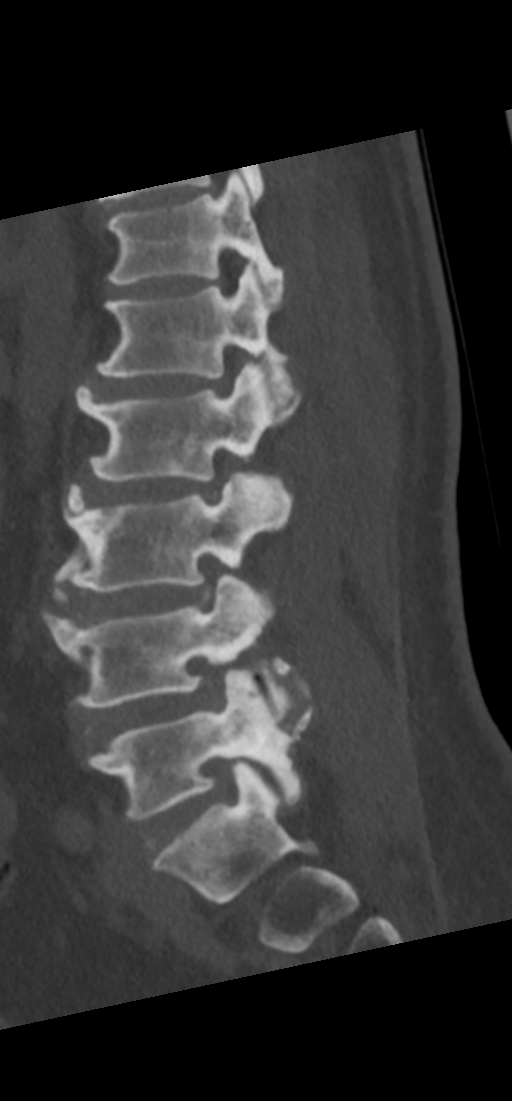

[10 of 33 positions shown; findings below may reference images not displayed]

FINDINGS: Segmentation: 5 lumbar type vertebrae.

Alignment: Grade 1 L4-5 anterolisthesis. Slight L2-3 retrolisthesis.

Vertebrae: No acute fracture or focal pathologic process.

Paraspinal and other soft tissues: Mild calcific atherosclerosis of
the abdominal aorta.

Disc levels: Congenitally narrow lumbar spinal canal with short
pedicles.

L1-2: Small disc bulge and mild facet hypertrophy. No significant
foraminal or canal stenosis.

L2-3: Slight retrolisthesis, small disc bulge, and mild bilateral
facet hypertrophy. Mild foraminal stenosis. No significant canal
stenosis.

L3-4: Disc bulge with facet and ligamentum flavum hypertrophy.
Probable left central disc protrusion. Mild bilateral foraminal
stenosis and probably moderate canal stenosis.

L4-5: Anterolisthesis with disc bulge, advanced facet, and
ligamentum flavum hypertrophy. Moderate bilateral foraminal stenosis
and probable severe canal stenosis.

L5-S1: Disc bulge with facet and ligamentum flavum hypertrophy.
Endplate marginal osteophytes. Moderate bilateral foraminal and
canal stenosis.
IMPRESSION: 1. Grade 1 L4-5 anterolisthesis and minimal L2-3 retrolisthesis.
2. No acute osseous abnormality.
3. Lumbar spondylosis with prominent L4-5 and L5-S1 facet arthrosis.
Congenitally narrow lumbar spinal canal with short pedicles.
4. Multifactorial probably severe L4-5 canal stenosis with
mild-to-moderate L3-4 and L5-S1 canal stenosis.

By: Nazareth Jumper M.D.

## 2018-10-07 DIAGNOSIS — Z0001 Encounter for general adult medical examination with abnormal findings: Secondary | ICD-10-CM | POA: Diagnosis not present

## 2018-10-07 DIAGNOSIS — I10 Essential (primary) hypertension: Secondary | ICD-10-CM | POA: Diagnosis not present

## 2018-10-07 DIAGNOSIS — Z1389 Encounter for screening for other disorder: Secondary | ICD-10-CM | POA: Diagnosis not present

## 2018-10-07 DIAGNOSIS — E782 Mixed hyperlipidemia: Secondary | ICD-10-CM | POA: Diagnosis not present

## 2018-10-07 DIAGNOSIS — E669 Obesity, unspecified: Secondary | ICD-10-CM | POA: Diagnosis not present

## 2019-03-10 DIAGNOSIS — I10 Essential (primary) hypertension: Secondary | ICD-10-CM | POA: Diagnosis not present

## 2019-03-10 DIAGNOSIS — Z0100 Encounter for examination of eyes and vision without abnormal findings: Secondary | ICD-10-CM | POA: Diagnosis not present

## 2019-03-10 DIAGNOSIS — M545 Low back pain: Secondary | ICD-10-CM | POA: Diagnosis not present

## 2019-03-10 DIAGNOSIS — Z0110 Encounter for examination of ears and hearing without abnormal findings: Secondary | ICD-10-CM | POA: Diagnosis not present

## 2019-03-10 DIAGNOSIS — K219 Gastro-esophageal reflux disease without esophagitis: Secondary | ICD-10-CM | POA: Diagnosis not present

## 2019-03-10 DIAGNOSIS — E669 Obesity, unspecified: Secondary | ICD-10-CM | POA: Diagnosis not present

## 2019-03-10 DIAGNOSIS — H538 Other visual disturbances: Secondary | ICD-10-CM | POA: Diagnosis not present

## 2019-03-10 DIAGNOSIS — E782 Mixed hyperlipidemia: Secondary | ICD-10-CM | POA: Diagnosis not present

## 2019-03-10 DIAGNOSIS — Z131 Encounter for screening for diabetes mellitus: Secondary | ICD-10-CM | POA: Diagnosis not present

## 2019-03-10 DIAGNOSIS — Z125 Encounter for screening for malignant neoplasm of prostate: Secondary | ICD-10-CM | POA: Diagnosis not present

## 2019-03-22 DIAGNOSIS — Z981 Arthrodesis status: Secondary | ICD-10-CM | POA: Diagnosis not present

## 2019-03-22 DIAGNOSIS — M5136 Other intervertebral disc degeneration, lumbar region: Secondary | ICD-10-CM | POA: Diagnosis not present

## 2019-03-22 DIAGNOSIS — M47816 Spondylosis without myelopathy or radiculopathy, lumbar region: Secondary | ICD-10-CM | POA: Diagnosis not present

## 2019-05-24 DIAGNOSIS — H6122 Impacted cerumen, left ear: Secondary | ICD-10-CM | POA: Diagnosis not present

## 2019-09-06 DIAGNOSIS — E669 Obesity, unspecified: Secondary | ICD-10-CM | POA: Diagnosis not present

## 2019-09-06 DIAGNOSIS — E782 Mixed hyperlipidemia: Secondary | ICD-10-CM | POA: Diagnosis not present

## 2019-09-06 DIAGNOSIS — I10 Essential (primary) hypertension: Secondary | ICD-10-CM | POA: Diagnosis not present

## 2019-09-06 DIAGNOSIS — K219 Gastro-esophageal reflux disease without esophagitis: Secondary | ICD-10-CM | POA: Diagnosis not present

## 2019-09-06 DIAGNOSIS — M545 Low back pain: Secondary | ICD-10-CM | POA: Diagnosis not present

## 2019-10-08 DIAGNOSIS — Z0001 Encounter for general adult medical examination with abnormal findings: Secondary | ICD-10-CM | POA: Diagnosis not present

## 2019-10-08 DIAGNOSIS — M545 Low back pain: Secondary | ICD-10-CM | POA: Diagnosis not present

## 2019-10-08 DIAGNOSIS — E782 Mixed hyperlipidemia: Secondary | ICD-10-CM | POA: Diagnosis not present

## 2019-10-08 DIAGNOSIS — K219 Gastro-esophageal reflux disease without esophagitis: Secondary | ICD-10-CM | POA: Diagnosis not present

## 2019-10-08 DIAGNOSIS — E669 Obesity, unspecified: Secondary | ICD-10-CM | POA: Diagnosis not present

## 2019-10-08 DIAGNOSIS — I10 Essential (primary) hypertension: Secondary | ICD-10-CM | POA: Diagnosis not present

## 2019-11-03 DIAGNOSIS — Z0001 Encounter for general adult medical examination with abnormal findings: Secondary | ICD-10-CM | POA: Diagnosis not present

## 2019-11-03 DIAGNOSIS — K219 Gastro-esophageal reflux disease without esophagitis: Secondary | ICD-10-CM | POA: Diagnosis not present

## 2019-11-03 DIAGNOSIS — I10 Essential (primary) hypertension: Secondary | ICD-10-CM | POA: Diagnosis not present

## 2019-11-03 DIAGNOSIS — E782 Mixed hyperlipidemia: Secondary | ICD-10-CM | POA: Diagnosis not present

## 2019-11-03 DIAGNOSIS — M5430 Sciatica, unspecified side: Secondary | ICD-10-CM | POA: Diagnosis not present

## 2019-11-03 DIAGNOSIS — E669 Obesity, unspecified: Secondary | ICD-10-CM | POA: Diagnosis not present

## 2019-11-03 DIAGNOSIS — M545 Low back pain: Secondary | ICD-10-CM | POA: Diagnosis not present

## 2019-11-25 DIAGNOSIS — M4326 Fusion of spine, lumbar region: Secondary | ICD-10-CM | POA: Diagnosis not present

## 2019-11-25 DIAGNOSIS — R269 Unspecified abnormalities of gait and mobility: Secondary | ICD-10-CM | POA: Diagnosis not present

## 2019-11-25 DIAGNOSIS — M545 Low back pain: Secondary | ICD-10-CM | POA: Diagnosis not present

## 2019-12-07 ENCOUNTER — Other Ambulatory Visit: Payer: Self-pay | Admitting: Rehabilitation

## 2019-12-07 DIAGNOSIS — M542 Cervicalgia: Secondary | ICD-10-CM

## 2019-12-07 DIAGNOSIS — M4326 Fusion of spine, lumbar region: Secondary | ICD-10-CM

## 2019-12-15 DIAGNOSIS — E669 Obesity, unspecified: Secondary | ICD-10-CM | POA: Diagnosis not present

## 2019-12-15 DIAGNOSIS — I10 Essential (primary) hypertension: Secondary | ICD-10-CM | POA: Diagnosis not present

## 2019-12-15 DIAGNOSIS — M545 Low back pain: Secondary | ICD-10-CM | POA: Diagnosis not present

## 2019-12-15 DIAGNOSIS — E782 Mixed hyperlipidemia: Secondary | ICD-10-CM | POA: Diagnosis not present

## 2019-12-15 DIAGNOSIS — K219 Gastro-esophageal reflux disease without esophagitis: Secondary | ICD-10-CM | POA: Diagnosis not present

## 2019-12-15 DIAGNOSIS — M5430 Sciatica, unspecified side: Secondary | ICD-10-CM | POA: Diagnosis not present

## 2019-12-29 ENCOUNTER — Ambulatory Visit
Admission: RE | Admit: 2019-12-29 | Discharge: 2019-12-29 | Disposition: A | Discharge status: Home / Self Care | Payer: Medicare Other | Source: Ambulatory Visit | Attending: Rehabilitation | Admitting: Rehabilitation

## 2019-12-29 ENCOUNTER — Other Ambulatory Visit: Payer: Self-pay

## 2019-12-29 DIAGNOSIS — M48061 Spinal stenosis, lumbar region without neurogenic claudication: Secondary | ICD-10-CM | POA: Diagnosis not present

## 2019-12-29 DIAGNOSIS — M4802 Spinal stenosis, cervical region: Secondary | ICD-10-CM | POA: Diagnosis not present

## 2019-12-29 DIAGNOSIS — M4326 Fusion of spine, lumbar region: Secondary | ICD-10-CM

## 2019-12-29 DIAGNOSIS — M542 Cervicalgia: Secondary | ICD-10-CM

## 2020-01-06 DIAGNOSIS — Z6836 Body mass index (BMI) 36.0-36.9, adult: Secondary | ICD-10-CM | POA: Diagnosis not present

## 2020-01-06 DIAGNOSIS — M5001 Cervical disc disorder with myelopathy,  high cervical region: Secondary | ICD-10-CM | POA: Diagnosis not present

## 2020-01-06 DIAGNOSIS — M4326 Fusion of spine, lumbar region: Secondary | ICD-10-CM | POA: Diagnosis not present

## 2020-01-06 DIAGNOSIS — R269 Unspecified abnormalities of gait and mobility: Secondary | ICD-10-CM | POA: Diagnosis not present

## 2020-01-14 DIAGNOSIS — M5431 Sciatica, right side: Secondary | ICD-10-CM | POA: Diagnosis not present

## 2020-01-14 DIAGNOSIS — M5432 Sciatica, left side: Secondary | ICD-10-CM | POA: Diagnosis not present

## 2020-01-14 DIAGNOSIS — M961 Postlaminectomy syndrome, not elsewhere classified: Secondary | ICD-10-CM | POA: Diagnosis not present

## 2020-01-21 ENCOUNTER — Other Ambulatory Visit: Payer: Self-pay | Admitting: Orthopedic Surgery

## 2020-01-21 DIAGNOSIS — M961 Postlaminectomy syndrome, not elsewhere classified: Secondary | ICD-10-CM

## 2020-02-02 NOTE — Progress Notes (Signed)
[facility]    Provider:  Dr Jaynee Eagles Requesting Provider: Starling Manns, MD Primary Care Provider:  Trey Sailors, PA  CC:  Bilateral leg pain  HPI:  Steven Ross is a 64 y.o. male here as requested by Starling Manns, MD for evaluation for EMG nerve conduction study by spine and scoliosis specialists.  Patient has low back pain, severity 8 out of 10, stable, occurs persistently, lower back, knees and lower legs, symptoms are aggravated by walking, no relieving factors, he has a past medical history of chronic joint pain, abnormal gait, lumbar spine ankylosis, lumbar fusion, he is on high blood pressure medication.  He complains of pain, numbness, tingling, burning in the bilateral lower extremities from the knees to the bottom of the feet, no pain in thighs or neck, onset was in 2015, worsens with prolonged standing and walking but sitting does not alleviate the pain at all, using a cane due to instability while walking, history L4-S1 by Dr. Cyndy Freeze in 2015 due to very severe L4-L5 central canal stenosis (see MRI 2018 on canopy), patient reports that all his symptoms were present preop, were unchanged postop, and continue to gradually worsen, he fell out of a tree in 1986 with injury to the back at that time.  He is here with family and an interpreter Humberto Leep. After surgery he felt better, now he has pain from the lateral side of the knee to the foot. This was there before surgery and never improved. His back is a lot better. Only right leg. He can hardly stand up. The left leg a little bit mostly the right leg, he can't wiggle his toes and burns and numbness. From the lateral knee to the foot. Gabapentin doesn't help. Hurts worse when he walks. He has weakness of dorsiflexion right foot. No other focal neurologic deficits, associated symptoms, inciting events or modifiable factors.  Reviewed notes, labs and imaging from outside physicians, which showed:  MRI lumbar spine  12/04/2016:  Disc levels:  L1-2: Small broad-based disc bulge without neural impingement. Minimal hypertrophy of the facet joints.  L2-3: Minimal disc bulging into the neural foramina without neural impingement. Slight hypertrophy of the facet joints and ligamentum flavum.  L3-4: Slight disc bulging into the neural foramina without neural impingement. Congenitally short pedicles with slight hypertrophy of the ligamentum flavum and facet joints combine to create mild spinal stenosis.  L4-5: Almost complete obliteration of the thecal sac due to marked hypertrophy of the ligamentum flavum and facet joints and congenitally short pedicles as well as a 2 mm spondylolisthesis and a broad-based bulge of the uncovered disc with small disc protrusions into both neural foramina. This has progressed since the prior study of 2015.  L5-S1: Minimal broad-based disc bulge asymmetric into the left neural foramen. Slight hypertrophy of the ligamentum flavum facet joints with narrowing of the left lateral recess which could affect the left S1 nerve. Moderate spinal stenosis. Congenitally short pedicles.  IMPRESSION: personally reviewed images and agree 1. Very severe spinal stenosis at L4-5 with almost complete obliteration of the thecal sac, primarily due to posterior element hypertrophy and congenitally short pedicles. 2. Moderate spinal stenosis at L5-S1. 3. Mild spinal stenosis at L3-4   MRI 2021:  Disc levels:  Congenitally short pedicles.  T12-L1: No significant disc bulge or herniation. Mild bilateral facet arthropathy. No stenosis.  L1-L2: Mild disc bulging and bilateral facet arthropathy. Mild bilateral neuroforaminal stenosis. No spinal canal stenosis.  L2-L3: Mild disc bulging and  bilateral facet arthropathy. Mild spinal canal and bilateral neuroforaminal stenosis.  L3-L4: Mild disc bulging and bilateral facet arthropathy. Moderate spinal canal stenosis. Mild to  moderate bilateral neuroforaminal stenosis.  L4-L5: Interval posterior decompression and PLIF. No residual stenosis.  L5-S1: Interval posterior decompression and PLIF. No residual stenosis.  IMPRESSION: 1. Interval L4-S1 posterior decompression and PLIF without residual stenosis. 2. Multilevel lumbar spondylosis as described above, worst at L3-L4 where there is moderate spinal canal stenosis. Findings are similar to prior CT.  Review of Systems: Patient complains of symptoms per HPI as well as the following symptoms: leg pain. Pertinent negatives and positives per HPI. All others negative.   Social History   Socioeconomic History  . Marital status: Married    Spouse name: Not on file  . Number of children: Not on file  . Years of education: Not on file  . Highest education level: Not on file  Occupational History  . Not on file  Tobacco Use  . Smoking status: Former Smoker    Types: Cigarettes  . Smokeless tobacco: Never Used  . Tobacco comment: quit smoking cigarettes in 2012  Vaping Use  . Vaping Use: Never used  Substance and Sexual Activity  . Alcohol use: No  . Drug use: No  . Sexual activity: Yes    Birth control/protection: None    Comment: last intercourse 3 weeks ago  Other Topics Concern  . Not on file  Social History Narrative   Patient lives at home with his wife and daughter   Social Determinants of Health   Financial Resource Strain:   . Difficulty of Paying Living Expenses: Not on file  Food Insecurity:   . Worried About Charity fundraiser in the Last Year: Not on file  . Ran Out of Food in the Last Year: Not on file  Transportation Needs:   . Lack of Transportation (Medical): Not on file  . Lack of Transportation (Non-Medical): Not on file  Physical Activity:   . Days of Exercise per Week: Not on file  . Minutes of Exercise per Session: Not on file  Stress:   . Feeling of Stress : Not on file  Social Connections:   . Frequency of  Communication with Friends and Family: Not on file  . Frequency of Social Gatherings with Friends and Family: Not on file  . Attends Religious Services: Not on file  . Active Member of Clubs or Organizations: Not on file  . Attends Archivist Meetings: Not on file  . Marital Status: Not on file  Intimate Partner Violence:   . Fear of Current or Ex-Partner: Not on file  . Emotionally Abused: Not on file  . Physically Abused: Not on file  . Sexually Abused: Not on file    Family History  Adopted: Yes  Problem Relation Age of Onset  . Neuropathy Neg Hx     Past Medical History:  Diagnosis Date  . Chronic joint pain   . Headache   . Hypertension   . Radiculopathy, lumbosacral region 2012  . Spondylolisthesis of lumbosacral region     Patient Active Problem List   Diagnosis Date Noted  . Spondylolisthesis of lumbosacral region 12/27/2016    Past Surgical History:  Procedure Laterality Date  . ABSCESS DRAINAGE    . ANTERIOR LAT LUMBAR FUSION N/A 12/27/2016   Procedure: Lumbar four-five Transpsoas lumbar interbody fusion with Lumbar five-Sacrum one  Posterior lumbar interbody fusion, Lumbar four-five  Gill procedure, Lumbar five-Sacrum  one Laminectomy for decompression, Lumbar four-Sacrum one cortical trajectory screw fixation and fusion with Mazor;  Surgeon: Ditty, Kevan Ny, MD;  Location: [facility];  Service: Neurosurgery;  Laterality: N/A;  . APPLICATION OF ROBOTIC ASSISTANCE FOR SPINAL PROCEDURE N/A 12/27/2016   Procedure: APPLICATION OF ROBOTIC ASSISTANCE FOR SPINAL PROCEDURE;  Surgeon: Ditty, Kevan Ny, MD;  Location: [place];  Service: Neurosurgery;  Laterality: N/A;    Current Outpatient Medications  Medication Sig Dispense Refill  . DULoxetine (CYMBALTA) 20 MG capsule Take 20 mg by mouth 2 (two) times daily.    Marland Kitchen lisinopril (ZESTRIL) 20 MG tablet Take 20 mg by mouth daily.     No current facility-administered medications for this visit.    Allergies as  of 02/04/2020  . (No Known Allergies)    Vitals: BP (!) 147/92   Pulse 90   Ht 5\' 4"  (1.626 m)   Wt 203 lb 6.4 oz (92.3 kg)   BMI 34.91 kg/m  Last Weight:  Wt Readings from Last 1 Encounters:  02/04/20 203 lb 6.4 oz (92.3 kg)   Last Height:   Ht Readings from Last 1 Encounters:  02/04/20 5\' 4"  (1.626 m)     Physical exam: Exam: Gen: NAD, conversant, well nourised, obese, well groomed                     CV: RRR, no MRG. No Carotid Bruits. No peripheral edema, warm, nontender Eyes: Conjunctivae clear without exudates or hemorrhage  Neuro: Detailed Neurologic Exam  Speech:    Speech is normal; fluent and spontaneous with normal comprehension.  Cognition:    The patient is oriented to person, place, and time;     recent and remote memory intact;     language fluent;     normal attention, concentration,     fund of knowledge Cranial Nerves:    The pupils are equal, round, and reactive to light. Pupils too small to visualize fundi. Visual fields are full to finger confrontation. Extraocular movements are intact. Trigeminal sensation is intact and the muscles of mastication are normal. The face is symmetric. The palate elevates in the midline. Hearing intact. Voice is normal. Shoulder shrug is normal. The tongue has normal motion without fasciculations.   Coordination:    No dysmetria or ataxia  Gait: cannot heel or toe walk. Wide based antalgic  Motor Observation:    No asymmetry, no atrophy, and no involuntary movements noted. Tone:    Normal muscle tone.    Posture:    Posture is normal. normal erect    Strength:    Strength is V/V in the upper and lower limbs.      Sensation: decreased L5/S1 distribution on the right     Reflex Exam:  DTR's: absent reflexes AJs, depressed right patellar, brisk left patellar     Toes:    The toes are downgoing bilaterally.   Clonus:    Clonus is absent.    Assessment/Plan:  Patient with residual leg pain after surgery  for decompression of very severe spinal stenosis 2018 L4/L5. He has pain from the lateral side of the right  knee to the foot. This was there before surgery and never improved. His back is a lot better. Discussed with patient that weakness and sensory changes may persist despite surgical intervention. Unclear if this is remote residual injury from his severe stenosis or if anything is currently ongoing (ie new radic vs peroneal mononeuropathy); but more recent MRI showed Interval  L4-S1 posterior decompression and PLIF without residual stenosis. Will proceed to emg/ncs.    EMG/ NCS right leg (left leg if needed)  Orders Placed This Encounter  Procedures  . NCV with EMG(electromyography)   No orders of the defined types were placed in this encounter.   Cc: Starling Manns, MD,  Trey Sailors, [place]  Sarina Ill, MD  [facility] [address] [place], [place] [address]  Phone [phone] Fax [phone]

## 2020-02-03 ENCOUNTER — Encounter: Payer: Self-pay | Admitting: *Deleted

## 2020-02-03 ENCOUNTER — Other Ambulatory Visit: Payer: Self-pay | Admitting: *Deleted

## 2020-02-04 ENCOUNTER — Other Ambulatory Visit: Payer: Self-pay

## 2020-02-04 ENCOUNTER — Encounter: Payer: Self-pay | Admitting: Neurology

## 2020-02-04 ENCOUNTER — Ambulatory Visit (INDEPENDENT_AMBULATORY_CARE_PROVIDER_SITE_OTHER): Payer: Medicare Other | Admitting: Neurology

## 2020-02-04 VITALS — BP 147/92 | HR 90 | Ht 64.0 in | Wt 203.4 lb

## 2020-02-04 DIAGNOSIS — R29898 Other symptoms and signs involving the musculoskeletal system: Secondary | ICD-10-CM | POA: Diagnosis not present

## 2020-02-04 NOTE — Patient Instructions (Addendum)
Electromyoneurogram (used google translate, may not be accurate, discussed through translator): Electromyoneurogram l m?t bi ki?m tra ?? ki?m tra xem cc c? v dy th?n kinh c?a b?n ?ang ho?t ??ng t?t nh? th? no. Quy trnh ny bao g?m vi?c s? d?ng k?t h?p ?i?n c? ?? (EMG) v nghin c?u d?n truy?n th?n kinh (NCS). EMG ???c s? d?ng ?? tm ki?m cc r?i lo?n c? b?p. NCS, cn ???c g?i l ?i?n th?n kinh, ?o l??ng m?c ?? th?n kinh c?a b?n ?ang ki?m sot c? b?p c?a b?n. Cc th? t?c th??ng ???c th?c hi?n cng nhau ?? ki?m tra xem c? v dy th?n kinh c?a b?n c kh?e m?nh hay khng. N?u k?t qu? xt nghi?m b?t th??ng, ?i?u ny c th? cho th?y b?nh t?t ho?c ch?n th??ng, ch?ng h?n nh? b?nh th?n kinh c? ho?c t?n th??ng dy th?n kinh ngo?i vi. Ni chung, ?y l m?t th? t?c an ton. Tuy nhin, cc v?n ?? c th? x?y ra, bao g?m: Nhi?m trng n?i cc ?i?n c?c ???c l?p vo. S? ch?y mu.  ??i v?i EMG  Nh cung c?p d?ch v? ch?m [place] s?c kh?e c?a b?n s? yu c?u b?n gi? nguyn t? th? ?? h? c th? ti?p c?n v?i c? s? ???c nghin c?u. B?n c th? ??ng, ng?i ho?c n?m. M?t cy kim r?t m?ng c ?i?n c?c s? ???c ??a vo c? c?a b?n. M?t ?i?n c?c nh? khc s? ???c ??t trn da c?a b?n g?n c?. Nh cung c?p d?ch v? ch?m [place] s?c kh?e c?a b?n s? yu c?u b?n ti?p t?c n?m yn. Cc ?i?n c?c s? g?i m?t tn hi?u cho bi?t v? ho?t ??ng ?i?n c?a c? b?p c?a b?n. B?n c th? nhn th?y ?i?u ny trn mn hnh ho?c nghe th?y n trong phng. Sau khi cc c? c?a b?n ? ???c nghin c?u ? tr?ng thi ngh? ng?i, nh cung c?p d?ch v? ch?m [place] s?c kh?e c?a b?n s? yu c?u b?n co ho?c u?n c?. Cc ?i?n c?c s? g?i m?t tn hi?u cho bi?t v? ho?t ??ng ?i?n c?a c? b?p c?a b?n.  M?t ?i?n c?c ghi l?i ho?t ??ng th?n kinh c?a b?n (?i?n c?c ghi) s? ???c ??t trn da c?a b?n b?i c? ?ang ???c nghin c?u. M?t ?i?n c?c ???c s? d?ng nh? m?t tham chi?u (?i?n c?c so snh) s? ???c ??t g?n ?i?n c?c ghi. M?t mi?ng dn ho?c gel s? ???c thoa ln da c?a b?n gi?a ?i?n c?c ghi v ?i?n c?c  so snh. Th?n kinh c?a b?n s? b? kch thch v?i m?t c s?c nh?Marland Kitchen Nh cung c?p d?ch v? ch?m [place] s?c kh?e c?a b?n s? ?o th?i gian c? b?p c?a b?n ph?n ?ng. Nh cung c?p d?ch v? ch?m [place] s?c kh?e c?a b?n s? tho cc ?i?n c?c v gel khi quy trnh k?t thc. Floyde Parkins trnh ny c th? khc nhau gi?a cc nh cung c?p d?ch v? ch?m [place] s?c kh?e v b?nh vi?n. ?i?u g x?y ra sau th? t?c? N l vo b?n ?? nh?n ???c k?t qu? c?a th? t?c c?a b?n. H?i nh cung c?p d?ch v? ch?m [place] s?c kh?e c?a b?n ho?c b? ph?n ?ang lm th? t?c, khi k?t qu? c?a b?n s? s?n sng. Nh cung c?p d?ch v? ch?m [place] s?c kh?e c?a b?n c th?: Cho b?n thu?c ?? gi?m ?au. Theo di cc v? tr chn ?? ??m b?o r?ng mu ng?ng ch?y. Tm l??c Electromyoneurogram l m?t bi ki?m tra ??  ki?m tra xem cc c? v dy th?n kinh c?a b?n ?ang ho?t ??ng t?t nh? th? no. N?u k?t qu? c?a cc xt nghi?m l b?t th??ng, ?i?u ny c th? cho th?y b?nh t?t ho?c ch?n th??ng. ?y l m?t th? t?c an ton. Tuy nhin, cc v?n ?? c th? x?y ra, ch?ng h?n nh? ch?y mu v nhi?m trng. Nh cung c?p d?ch v? ch?m [place] s?c kh?e c?a b?n s? lm hai xt nghi?m ?? hon t?t th? t?c ny. M?t ki?m tra c? b?p c?a b?n (EMG) v m?t ki?m tra dy th?n kinh c?a b?n (NCS).   Electromyoneurogram Electromyoneurogram is a test to check how well your muscles and nerves are working. This procedure includes the combined use of electromyogram (EMG) and nerve conduction study (NCS). EMG is used to look for muscular disorders. NCS, which is also called electroneurogram, measures how well your nerves are controlling your muscles. The procedures are usually done together to check if your muscles and nerves are healthy. If the results of the tests are abnormal, this may indicate disease or injury, such as a neuromuscular disease or peripheral nerve damage. Tell a health care provider about:  Any allergies you have.  All medicines you are taking, including vitamins, herbs, eye drops, creams, and over-the-counter  medicines.  Any problems you or family members have had with anesthetic medicines.  Any blood disorders you have.  Any surgeries you have had.  Any medical conditions you have.  If you have a pacemaker.  Whether you are pregnant or may be pregnant. What are the risks? Generally, this is a safe procedure. However, problems may occur, including:  Infection where the electrodes were inserted.  Bleeding. What happens before the procedure? Medicines Ask your health care provider about:  Changing or stopping your regular medicines. This is especially important if you are taking diabetes medicines or blood thinners.  Taking medicines such as aspirin and ibuprofen. These medicines can thin your blood. Do not take these medicines unless your health care provider tells you to take them.  Taking over-the-counter medicines, vitamins, herbs, and supplements. General instructions  Your health care provider may ask you to avoid: ? Beverages that have caffeine, such as coffee and tea. ? Any products that contain nicotine or tobacco. These products include cigarettes, e-cigarettes, and chewing tobacco. If you need help quitting, ask your health care provider.  Do not use lotions or creams on the same day that you will be having the procedure. What happens during the procedure? For EMG   Your health care provider will ask you to stay in a position so that he or she can access the muscle that will be studied. You may be standing, sitting, or lying down.  You may be given a medicine that numbs the area (local anesthetic).  A very thin needle that has an electrode will be inserted into your muscle.  Another small electrode will be placed on your skin near the muscle.  Your health care provider will ask you to continue to remain still.  The electrodes will send a signal that tells about the electrical activity of your muscles. You may see this on a monitor or hear it in the room.  After  your muscles have been studied at rest, your health care provider will ask you to contract or flex your muscles. The electrodes will send a signal that tells about the electrical activity of your muscles.  Your health care provider will remove the electrodes and the  electrode needles when the procedure is finished. The procedure may vary among health care providers and hospitals. For NCS   An electrode that records your nerve activity (recording electrode) will be placed on your skin by the muscle that is being studied.  An electrode that is used as a reference (reference electrode) will be placed near the recording electrode.  A paste or gel will be applied to your skin between the recording electrode and the reference electrode.  Your nerve will be stimulated with a mild shock. Your health care provider will measure how much time it takes for your muscle to react.  Your health care provider will remove the electrodes and the gel when the procedure is finished. The procedure may vary among health care providers and hospitals. What happens after the procedure?  It is up to you to get the results of your procedure. Ask your health care provider, or the department that is doing the procedure, when your results will be ready.  Your health care provider may: ? Give you medicines for any pain. ? Monitor the insertion sites to make sure that bleeding stops. Summary  Electromyoneurogram is a test to check how well your muscles and nerves are working.  If the results of the tests are abnormal, this may indicate disease or injury.  This is a safe procedure. However, problems may occur, such as bleeding and infection.  Your health care provider will do two tests to complete this procedure. One checks your muscles (EMG) and another checks your nerves (NCS).  It is up to you to get the results of your procedure. Ask your health care provider, or the department that is doing the procedure, when  your results will be ready. This information is not intended to replace advice given to you by your health care provider. Make sure you discuss any questions you have with your health care provider. Document Revised: 12/23/2017 Document Reviewed: 12/05/2017 Elsevier Patient Education  [place].

## 2020-02-08 ENCOUNTER — Encounter: Payer: Self-pay | Admitting: Neurology

## 2020-02-23 DIAGNOSIS — Z1159 Encounter for screening for other viral diseases: Secondary | ICD-10-CM | POA: Diagnosis not present

## 2020-02-28 DIAGNOSIS — K621 Rectal polyp: Secondary | ICD-10-CM | POA: Diagnosis not present

## 2020-02-28 DIAGNOSIS — D120 Benign neoplasm of cecum: Secondary | ICD-10-CM | POA: Diagnosis not present

## 2020-02-28 DIAGNOSIS — Z1211 Encounter for screening for malignant neoplasm of colon: Secondary | ICD-10-CM | POA: Diagnosis not present

## 2020-03-01 DIAGNOSIS — K621 Rectal polyp: Secondary | ICD-10-CM | POA: Diagnosis not present

## 2020-03-01 DIAGNOSIS — D120 Benign neoplasm of cecum: Secondary | ICD-10-CM | POA: Diagnosis not present

## 2020-03-02 DIAGNOSIS — I10 Essential (primary) hypertension: Secondary | ICD-10-CM | POA: Diagnosis not present

## 2020-03-02 DIAGNOSIS — E669 Obesity, unspecified: Secondary | ICD-10-CM | POA: Diagnosis not present

## 2020-03-02 DIAGNOSIS — K219 Gastro-esophageal reflux disease without esophagitis: Secondary | ICD-10-CM | POA: Diagnosis not present

## 2020-03-02 DIAGNOSIS — E782 Mixed hyperlipidemia: Secondary | ICD-10-CM | POA: Diagnosis not present

## 2020-03-02 DIAGNOSIS — M5430 Sciatica, unspecified side: Secondary | ICD-10-CM | POA: Diagnosis not present

## 2020-03-06 ENCOUNTER — Encounter: Payer: Medicare Other | Admitting: Neurology

## 2020-03-06 ENCOUNTER — Ambulatory Visit (INDEPENDENT_AMBULATORY_CARE_PROVIDER_SITE_OTHER): Payer: Medicare Other | Admitting: Neurology

## 2020-03-06 DIAGNOSIS — G629 Polyneuropathy, unspecified: Secondary | ICD-10-CM | POA: Diagnosis not present

## 2020-03-06 DIAGNOSIS — M79604 Pain in right leg: Secondary | ICD-10-CM

## 2020-03-06 DIAGNOSIS — E519 Thiamine deficiency, unspecified: Secondary | ICD-10-CM

## 2020-03-06 DIAGNOSIS — R29898 Other symptoms and signs involving the musculoskeletal system: Secondary | ICD-10-CM

## 2020-03-06 DIAGNOSIS — E531 Pyridoxine deficiency: Secondary | ICD-10-CM

## 2020-03-06 DIAGNOSIS — R5382 Chronic fatigue, unspecified: Secondary | ICD-10-CM | POA: Diagnosis not present

## 2020-03-06 DIAGNOSIS — E538 Deficiency of other specified B group vitamins: Secondary | ICD-10-CM | POA: Diagnosis not present

## 2020-03-06 DIAGNOSIS — Z0289 Encounter for other administrative examinations: Secondary | ICD-10-CM

## 2020-03-06 DIAGNOSIS — R202 Paresthesia of skin: Secondary | ICD-10-CM

## 2020-03-06 DIAGNOSIS — R7309 Other abnormal glucose: Secondary | ICD-10-CM | POA: Diagnosis not present

## 2020-03-06 NOTE — Progress Notes (Signed)
See procedure note.

## 2020-03-06 NOTE — Progress Notes (Signed)
Discussed findings of emg/ncs. Chronic changes L5/s1. Also mild sensory polyneuropathy will check several labs.(Addendum: HgbA1c 6.0, prediabetes which can cause sensory neuropathy, follow up with primary care)  Orders Placed This Encounter  Procedures  . Hemoglobin A1c  . B12 and Folate Panel  . Methylmalonic acid, serum  . Multiple Myeloma Panel (SPEP&IFE w/QIG)  . TSH  . Heavy metals, blood  . Vitamin B6  . Vitamin B1    I spent 15  minutes of face-to-face and non-face-to-face time with patient on the  1. Right leg weakness   2. Peripheral polyneuropathy   3. screening for Vitamin B6 deficiency   4. screening for Vitamin B1 deficiency   5. Chronic fatigue   6. Elevated glucose   7. screening for B12 deficiency    diagnosis.  This included previsit chart review, lab review, study review, order entry, electronic health record documentation, patient education on the different diagnostic and therapeutic options, counseling and coordination of care, risks and benefits of management, compliance, or risk factor reduction. This does not include time spent on emg/ncs.

## 2020-03-06 NOTE — Progress Notes (Signed)
Full Name: Squire Withey Gender: Male MRN #: [id number] Date of Birth: February 24, 1956    Visit Date: 03/06/2020 12:28 Age: 64 Years Examining Physician: Sarina Ill, MD  Referring Physician: Rennis Harding, MD Height: 5 feet 4 inch Patient weight: 203lbs    Patient History: Patient with residual leg pain after surgery for decompression of very severe spinal stenosis 2018 L4/L5  Summary: The right peroneal motor nerve showed delayed distal onset latency (8.8 ms, normal less than 6.5) and reduced amplitude (1.6 mV, normal greater than 2) and decreased conduction velocity (fib head to ankle 36 m/s, normal greater than 44) and decreased conduction velocity (pop fossa to fib head, 35 m/s, normal greater than 44).  The left peroneal motor nerve showed no response.  The right and left tibial motor nerves showed no response.  The left sural sensory nerve showed no response.  The left superficial peroneal sensory nerve showed delayed distal peak latency (5.2 ms, normal less than 4.4).  The right peroneal F wave showed no response.  The right tibial F wave showed no response.All remaining nerves (as indicated in the following tables) were within normal limits.  The right peroneus longus showed increased motor unit amplitude, polyphasic motor units and diminished motor unit recruitment.  The right gastrocnemius showed diminished motor unit recruitment.  The right extensor hallucis longus showed prolonged motor unit duration, polyphasic motor units and diminished motor unit recruitment. All remaining muscles (as indicated in the following tables) were within normal limits.    Conclusion: There is electrophysiologic evidence for chronic/remote L5-S1 radiculopathy.  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    ------------------------------- Sarina Ill, M.D.  [facility] [place], [place] [address] Tel: [phone] Fax: [phone]  Verbal  informed consent was obtained from the patient, patient was informed of potential risk of procedure, including bruising, bleeding, hematoma formation, infection, muscle weakness, muscle pain, numbness, among others.         [place]    Nerve / Sites Muscle Latency Ref. Amplitude Ref. Rel Amp Segments Distance Velocity Ref. Area    ms ms mV mV %  cm m/s m/s mVms  L Ulnar - ADM     Wrist ADM 3.3 ?3.3 10.7 ?6.0 100 Wrist - ADM 7   32.7     B.Elbow ADM 6.6  9.0  84.3 B.Elbow - Wrist 20 60 ?49 30.4     A.Elbow ADM 8.3  8.9  99.3 A.Elbow - B.Elbow 10 58 ?49 31.3         A.Elbow - Wrist      R Peroneal - EDB     Ankle EDB 8.8 ?6.5 1.6 ?2.0 100 Ankle - EDB 9   8.6     Fib head EDB 15.7  1.6  105 Fib head - Ankle 25 36 ?44 9.3     Pop fossa EDB 18.5  1.6  97.9 Pop fossa - Fib head 10 35 ?44 8.7         Pop fossa - Ankle      L Peroneal - EDB     Ankle EDB NR ?6.5 NR ?2.0 NR Ankle - EDB 9   NR     Fib head EDB NR  NR  NR Fib head - Ankle 24 NR ?44 NR     Pop fossa EDB NR  NR  NR Pop fossa - Fib head 10 NR ?44 NR         Pop  fossa - Ankle      R Tibial - AH     Ankle AH NR ?5.8 NR ?4.0 NR Ankle - AH 9   NR     Pop fossa AH NR  NR  NR Pop fossa - Ankle 34 NR ?41 NR  L Tibial - AH     Ankle AH NR ?5.8 NR ?4.0 NR Ankle - AH 9   NR     Pop fossa AH NR  NR  NR Pop fossa - Ankle 33 NR ?41 NR                SNC    Nerve / Sites Rec. Site Peak Lat Ref.  Amp Ref. Segments Distance    ms ms V V  cm  R Sural - Ankle (Calf)     Calf Ankle 4.0 ?4.4 19 ?6 Calf - Ankle 14  L Sural - Ankle (Calf)     Calf Ankle NR ?4.4 NR ?6 Calf - Ankle 14  R Superficial peroneal - Ankle     Lat leg Ankle 4.4 ?4.4 9 ?6 Lat leg - Ankle 14  L Superficial peroneal - Ankle     Lat leg Ankle 5.2 ?4.4 12 ?6 Lat leg - Ankle 14  L Ulnar - Orthodromic, (Dig V, Mid palm)     Dig V Wrist 3.1 ?3.1 5 ?5 Dig V - Wrist 49               F  Wave    Nerve F Lat Ref.   ms ms  R Peroneal - EDB NR ?56.0  R Tibial - AH NR ?56.0  L  Ulnar - ADM 27.8 ?32.0           EMG Summary Table    Spontaneous MUAP Recruitment  Muscle IA Fib PSW Fasc Other Amp Dur. Poly Pattern  R. Vastus medialis Normal None None None _______ Normal Normal Normal Normal  R. Tibialis anterior Normal None None None _______ Normal Normal Normal Normal  R. Peroneus longus Normal None None None _______ Increased Normal 2+ Reduced  R. Gastrocnemius (Medial head) Normal None None None _______ Normal Normal Normal Reduced  R. Extensor hallucis longus Normal None None None _______ Normal Increased 2+ Reduced  R. Biceps femoris (long head) Normal None None None _______ Normal Normal Normal Normal  R. Gluteus maximus Normal None None None _______ Normal Normal Normal Normal  R. Gluteus medius Normal None None None _______ Normal Normal Normal Normal

## 2020-03-07 LAB — MULTIPLE MYELOMA PANEL, SERUM

## 2020-03-07 LAB — B12 AND FOLATE PANEL: Folate: 9.2 ng/mL (ref >3.0)

## 2020-03-07 LAB — HEAVY METALS, BLOOD

## 2020-03-07 LAB — METHYLMALONIC ACID, SERUM

## 2020-03-08 DIAGNOSIS — I10 Essential (primary) hypertension: Secondary | ICD-10-CM | POA: Diagnosis not present

## 2020-03-08 LAB — MULTIPLE MYELOMA PANEL, SERUM
Albumin/Glob SerPl: 1.1 (ref 0.7–1.7)
Gamma Glob SerPl Elph-Mcnc: 1.6 g/dL (ref 0.4–1.8)
IgG (Immunoglobin G), Serum: 1662 mg/dL — ABNORMAL HIGH (ref 603–1613)

## 2020-03-08 LAB — HEAVY METALS, BLOOD: Arsenic: 2 ug/L (ref 2–23)

## 2020-03-08 LAB — VITAMIN B1

## 2020-03-11 LAB — B12 AND FOLATE PANEL: Vitamin B-12: 820 pg/mL (ref 232–1245)

## 2020-03-11 LAB — MULTIPLE MYELOMA PANEL, SERUM
Alpha 1: 0.2 g/dL (ref 0.0–0.4)
Alpha2 Glob SerPl Elph-Mcnc: 0.6 g/dL (ref 0.4–1.0)
Globulin, Total: 3.6 g/dL (ref 2.2–3.9)
IgA/Immunoglobulin A, Serum: 170 mg/dL (ref 61–437)

## 2020-03-11 LAB — VITAMIN B6: Vitamin B6: 9.8 ug/L (ref 5.3–46.7)

## 2020-03-11 LAB — TSH: TSH: 1.35 u[IU]/mL (ref 0.450–4.500)

## 2020-03-11 LAB — HEMOGLOBIN A1C
Est. average glucose Bld gHb Est-mCnc: 126 mg/dL
Hgb A1c MFr Bld: 6 % — ABNORMAL HIGH (ref 4.8–5.6)

## 2020-03-11 LAB — HEAVY METALS, BLOOD: Lead, Blood: <1 ug/dL (ref 0–4)

## 2020-03-13 NOTE — Procedures (Signed)
Full Name: Cleon Signorelli Gender: Male MRN #: [id number] Date of Birth: 1956/01/27    Visit Date: 03/06/2020 12:28 Age: 64 Years Examining Physician: Sarina Ill, MD  Referring Physician: Rennis Harding, MD Height: 5 feet 4 inch Patient weight: 203lbs    Patient History: Patient with residual leg pain after surgery for decompression of very severe spinal stenosis 2018 L4/L5  Summary: The right peroneal motor nerve showed delayed distal onset latency (8.8 ms, normal less than 6.5) and reduced amplitude (1.6 mV, normal greater than 2) and decreased conduction velocity (fib head to ankle 36 m/s, normal greater than 44) and decreased conduction velocity (pop fossa to fib head, 35 m/s, normal greater than 44).  The left peroneal motor nerve showed no response.  The right and left tibial motor nerves showed no response.  The left sural sensory nerve showed no response.  The left superficial peroneal sensory nerve showed delayed distal peak latency (5.2 ms, normal less than 4.4).  The right peroneal F wave showed no response.  The right tibial F wave showed no response.All remaining nerves (as indicated in the following tables) were within normal limits.  The right peroneus longus showed increased motor unit amplitude, polyphasic motor units and diminished motor unit recruitment.  The right gastrocnemius showed diminished motor unit recruitment.  The right extensor hallucis longus showed prolonged motor unit duration, polyphasic motor units and diminished motor unit recruitment. All remaining muscles (as indicated in the following tables) were within normal limits.    Conclusion: There is electrophysiologic evidence for chronic/remote L5-S1 radiculopathy.  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    ------------------------------- Sarina Ill, M.D.  [facility] [place], [place] [address] Tel: [phone] Fax: [phone]  Verbal  informed consent was obtained from the patient, patient was informed of potential risk of procedure, including bruising, bleeding, hematoma formation, infection, muscle weakness, muscle pain, numbness, among others.         [place]    Nerve / Sites Muscle Latency Ref. Amplitude Ref. Rel Amp Segments Distance Velocity Ref. Area    ms ms mV mV %  cm m/s m/s mVms  L Ulnar - ADM     Wrist ADM 3.3 ?3.3 10.7 ?6.0 100 Wrist - ADM 7   32.7     B.Elbow ADM 6.6  9.0  84.3 B.Elbow - Wrist 20 60 ?49 30.4     A.Elbow ADM 8.3  8.9  99.3 A.Elbow - B.Elbow 10 58 ?49 31.3         A.Elbow - Wrist      R Peroneal - EDB     Ankle EDB 8.8 ?6.5 1.6 ?2.0 100 Ankle - EDB 9   8.6     Fib head EDB 15.7  1.6  105 Fib head - Ankle 25 36 ?44 9.3     Pop fossa EDB 18.5  1.6  97.9 Pop fossa - Fib head 10 35 ?44 8.7         Pop fossa - Ankle      L Peroneal - EDB     Ankle EDB NR ?6.5 NR ?2.0 NR Ankle - EDB 9   NR     Fib head EDB NR  NR  NR Fib head - Ankle 24 NR ?44 NR     Pop fossa EDB NR  NR  NR Pop fossa - Fib head 10 NR ?44 NR         Pop  fossa - Ankle      R Tibial - AH     Ankle AH NR ?5.8 NR ?4.0 NR Ankle - AH 9   NR     Pop fossa AH NR  NR  NR Pop fossa - Ankle 34 NR ?41 NR  L Tibial - AH     Ankle AH NR ?5.8 NR ?4.0 NR Ankle - AH 9   NR     Pop fossa AH NR  NR  NR Pop fossa - Ankle 33 NR ?41 NR                SNC    Nerve / Sites Rec. Site Peak Lat Ref.  Amp Ref. Segments Distance    ms ms V V  cm  R Sural - Ankle (Calf)     Calf Ankle 4.0 ?4.4 19 ?6 Calf - Ankle 14  L Sural - Ankle (Calf)     Calf Ankle NR ?4.4 NR ?6 Calf - Ankle 14  R Superficial peroneal - Ankle     Lat leg Ankle 4.4 ?4.4 9 ?6 Lat leg - Ankle 14  L Superficial peroneal - Ankle     Lat leg Ankle 5.2 ?4.4 12 ?6 Lat leg - Ankle 14  L Ulnar - Orthodromic, (Dig V, Mid palm)     Dig V Wrist 3.1 ?3.1 5 ?5 Dig V - Wrist 43               F  Wave    Nerve F Lat Ref.   ms ms  R Peroneal - EDB NR ?56.0  R Tibial - AH NR ?56.0  L  Ulnar - ADM 27.8 ?32.0           EMG Summary Table    Spontaneous MUAP Recruitment  Muscle IA Fib PSW Fasc Other Amp Dur. Poly Pattern  R. Vastus medialis Normal None None None _______ Normal Normal Normal Normal  R. Tibialis anterior Normal None None None _______ Normal Normal Normal Normal  R. Peroneus longus Normal None None None _______ Increased Normal 2+ Reduced  R. Gastrocnemius (Medial head) Normal None None None _______ Normal Normal Normal Reduced  R. Extensor hallucis longus Normal None None None _______ Normal Increased 2+ Reduced  R. Biceps femoris (long head) Normal None None None _______ Normal Normal Normal Normal  R. Gluteus maximus Normal None None None _______ Normal Normal Normal Normal  R. Gluteus medius Normal None None None _______ Normal Normal Normal Normal

## 2020-03-14 ENCOUNTER — Telehealth: Payer: Self-pay | Admitting: *Deleted

## 2020-03-14 NOTE — Telephone Encounter (Signed)
I called the pt and discussed his lab results via [employer] language). Pt verbalized understanding and appreciation. He asked for the lab results to be mailed to his home. The pt confirmed his address on file is correct.

## 2020-03-14 NOTE — Telephone Encounter (Signed)
-----   Message from Melvenia Beam, MD sent at 03/09/2020 10:23 AM EST ----- Patient has pre-diabetes. Would recommend diet and exercise and talking to your primary care doctor.

## 2020-03-14 NOTE — Telephone Encounter (Signed)
Done

## 2020-04-28 DIAGNOSIS — U071 COVID-19: Secondary | ICD-10-CM | POA: Diagnosis not present

## 2020-06-01 DIAGNOSIS — E669 Obesity, unspecified: Secondary | ICD-10-CM | POA: Diagnosis not present

## 2020-06-01 DIAGNOSIS — I10 Essential (primary) hypertension: Secondary | ICD-10-CM | POA: Diagnosis not present

## 2020-06-01 DIAGNOSIS — R3911 Hesitancy of micturition: Secondary | ICD-10-CM | POA: Diagnosis not present

## 2020-06-01 DIAGNOSIS — E782 Mixed hyperlipidemia: Secondary | ICD-10-CM | POA: Diagnosis not present

## 2020-06-01 DIAGNOSIS — Z125 Encounter for screening for malignant neoplasm of prostate: Secondary | ICD-10-CM | POA: Diagnosis not present

## 2020-06-01 DIAGNOSIS — M5430 Sciatica, unspecified side: Secondary | ICD-10-CM | POA: Diagnosis not present

## 2020-06-01 DIAGNOSIS — K219 Gastro-esophageal reflux disease without esophagitis: Secondary | ICD-10-CM | POA: Diagnosis not present

## 2020-06-01 DIAGNOSIS — R5383 Other fatigue: Secondary | ICD-10-CM | POA: Diagnosis not present

## 2020-07-13 DIAGNOSIS — R3911 Hesitancy of micturition: Secondary | ICD-10-CM | POA: Diagnosis not present

## 2020-07-13 DIAGNOSIS — E669 Obesity, unspecified: Secondary | ICD-10-CM | POA: Diagnosis not present

## 2020-07-13 DIAGNOSIS — M5430 Sciatica, unspecified side: Secondary | ICD-10-CM | POA: Diagnosis not present

## 2020-07-13 DIAGNOSIS — E782 Mixed hyperlipidemia: Secondary | ICD-10-CM | POA: Diagnosis not present

## 2020-07-13 DIAGNOSIS — M25561 Pain in right knee: Secondary | ICD-10-CM | POA: Diagnosis not present

## 2020-07-13 DIAGNOSIS — R5383 Other fatigue: Secondary | ICD-10-CM | POA: Diagnosis not present

## 2020-07-13 DIAGNOSIS — K219 Gastro-esophageal reflux disease without esophagitis: Secondary | ICD-10-CM | POA: Diagnosis not present

## 2020-07-13 DIAGNOSIS — I10 Essential (primary) hypertension: Secondary | ICD-10-CM | POA: Diagnosis not present

## 2020-08-11 DIAGNOSIS — I10 Essential (primary) hypertension: Secondary | ICD-10-CM | POA: Diagnosis not present

## 2020-08-11 DIAGNOSIS — M5430 Sciatica, unspecified side: Secondary | ICD-10-CM | POA: Diagnosis not present

## 2020-08-11 DIAGNOSIS — E782 Mixed hyperlipidemia: Secondary | ICD-10-CM | POA: Diagnosis not present

## 2020-08-11 DIAGNOSIS — K219 Gastro-esophageal reflux disease without esophagitis: Secondary | ICD-10-CM | POA: Diagnosis not present

## 2020-08-11 DIAGNOSIS — E669 Obesity, unspecified: Secondary | ICD-10-CM | POA: Diagnosis not present

## 2020-08-11 DIAGNOSIS — R3911 Hesitancy of micturition: Secondary | ICD-10-CM | POA: Diagnosis not present

## 2020-10-09 DIAGNOSIS — E782 Mixed hyperlipidemia: Secondary | ICD-10-CM | POA: Diagnosis not present

## 2020-10-09 DIAGNOSIS — E669 Obesity, unspecified: Secondary | ICD-10-CM | POA: Diagnosis not present

## 2020-10-09 DIAGNOSIS — M545 Low back pain, unspecified: Secondary | ICD-10-CM | POA: Diagnosis not present

## 2020-10-09 DIAGNOSIS — I739 Peripheral vascular disease, unspecified: Secondary | ICD-10-CM | POA: Diagnosis not present

## 2020-10-09 DIAGNOSIS — Z131 Encounter for screening for diabetes mellitus: Secondary | ICD-10-CM | POA: Diagnosis not present

## 2020-10-09 DIAGNOSIS — Z0001 Encounter for general adult medical examination with abnormal findings: Secondary | ICD-10-CM | POA: Diagnosis not present

## 2020-10-09 DIAGNOSIS — K219 Gastro-esophageal reflux disease without esophagitis: Secondary | ICD-10-CM | POA: Diagnosis not present

## 2020-10-09 DIAGNOSIS — R5383 Other fatigue: Secondary | ICD-10-CM | POA: Diagnosis not present

## 2020-10-09 DIAGNOSIS — I10 Essential (primary) hypertension: Secondary | ICD-10-CM | POA: Diagnosis not present

## 2020-11-02 ENCOUNTER — Other Ambulatory Visit: Payer: Self-pay

## 2020-11-02 DIAGNOSIS — I739 Peripheral vascular disease, unspecified: Secondary | ICD-10-CM

## 2020-11-09 ENCOUNTER — Other Ambulatory Visit: Payer: Self-pay

## 2020-11-09 ENCOUNTER — Ambulatory Visit (INDEPENDENT_AMBULATORY_CARE_PROVIDER_SITE_OTHER): Payer: Medicare Other | Admitting: Vascular Surgery

## 2020-11-09 ENCOUNTER — Ambulatory Visit (HOSPITAL_COMMUNITY)
Admission: RE | Admit: 2020-11-09 | Discharge: 2020-11-09 | Disposition: A | Discharge status: Home / Self Care | Payer: Medicare Other | Source: Ambulatory Visit | Attending: Vascular Surgery | Admitting: Vascular Surgery

## 2020-11-09 ENCOUNTER — Encounter: Payer: Self-pay | Admitting: Vascular Surgery

## 2020-11-09 VITALS — BP 151/76 | HR 76 | Temp 97.7°F | Resp 20 | Ht 64.0 in | Wt 199.0 lb

## 2020-11-09 DIAGNOSIS — G609 Hereditary and idiopathic neuropathy, unspecified: Secondary | ICD-10-CM | POA: Diagnosis not present

## 2020-11-09 DIAGNOSIS — I739 Peripheral vascular disease, unspecified: Secondary | ICD-10-CM | POA: Insufficient documentation

## 2020-11-09 NOTE — Progress Notes (Signed)
Referring Physician: Raelyn Number PA  Patient name: Steven Ross MRN: [id number] DOB: 1956-04-18 Sex: male  REASON FOR CONSULT: Leg pain  HPI: Interview was conducted through a [employer] provided interpreter. Steven Ross is a 65 y.o. male, with a 3-year history of burning aching pain numbness and tingling in the foot up to the calf both legs.  Right leg is worse than the left.  Patient states he has been having the symptoms ever since his back operation.  He states his legs hurt continuously.  He states the pain does become worse walking to the end of his driveway.  It is not really improved with rest.  He has no nonhealing wounds.  He does not smoke.  He smoked in the past but quit.  He does not really consume much alcohol.  He has no history of diabetes.  His chart lists family history of neuropathy.  However he does not recall this.   Past Medical History:  Diagnosis Date   Chronic joint pain    Headache    Hypertension    Radiculopathy, lumbosacral region 2012   Spondylolisthesis of lumbosacral region    Past Surgical History:  Procedure Laterality Date   ABSCESS DRAINAGE     ANTERIOR LAT LUMBAR FUSION N/A 12/27/2016   Procedure: Lumbar four-five Transpsoas lumbar interbody fusion with Lumbar five-Sacrum one  Posterior lumbar interbody fusion, Lumbar four-five  Gill procedure, Lumbar five-Sacrum one Laminectomy for decompression, Lumbar four-Sacrum one cortical trajectory screw fixation and fusion with Mazor;  Surgeon: Ditty, Kevan Ny, MD;  Location: [facility];  Service: Neurosurgery;  Laterality: N/A;   APPLICATION OF ROBOTIC ASSISTANCE FOR SPINAL PROCEDURE N/A 12/27/2016   Procedure: APPLICATION OF ROBOTIC ASSISTANCE FOR SPINAL PROCEDURE;  Surgeon: Ditty, Kevan Ny, MD;  Location: [facility];  Service: Neurosurgery;  Laterality: N/A;    Family History  Adopted: Yes  Problem Relation Age of Onset   Neuropathy Neg Hx     SOCIAL HISTORY: Social History   Socioeconomic History    Marital status: Married    Spouse name: Not on file   Number of children: Not on file   Years of education: Not on file   Highest education level: Not on file  Occupational History   Not on file  Tobacco Use   Smoking status: Former    Types: Cigarettes   Smokeless tobacco: Never   Tobacco comments:    quit smoking cigarettes in 2012  Vaping Use   Vaping Use: Never used  Substance and Sexual Activity   Alcohol use: No   Drug use: No   Sexual activity: Yes    Birth control/protection: None    Comment: last intercourse 3 weeks ago  Other Topics Concern   Not on file  Social History Narrative   Patient lives at home with his wife and daughter   Social Determinants of Health   Financial Resource Strain: Not on file  Food Insecurity: Not on file  Transportation Needs: Not on file  Physical Activity: Not on file  Stress: Not on file  Social Connections: Not on file  Intimate Partner Violence: Not on file    No Known Allergies  Current Outpatient Medications  Medication Sig Dispense Refill   aspirin 81 MG chewable tablet 1 tab(s)     diclofenac (VOLTAREN) 75 MG EC tablet 1 tab(s)     DULoxetine (CYMBALTA) 20 MG capsule Take 20 mg by mouth 2 (two) times daily.     ibuprofen (ADVIL) 800  MG tablet 1 tab(s)     lisinopril (ZESTRIL) 20 MG tablet Take 20 mg by mouth daily.     omeprazole (PRILOSEC) 20 MG capsule 1 cap(s)     tamsulosin (FLOMAX) 0.4 MG CAPS capsule 1 cap(s)     No current facility-administered medications for this visit.    ROS:   General:  No weight loss, Fever, chills  HEENT: No recent headaches, no nasal bleeding, no visual changes, no sore throat  Neurologic: No dizziness, blackouts, seizures. No recent symptoms of stroke or mini- stroke. No recent episodes of slurred speech, or temporary blindness.  Cardiac: No recent episodes of chest pain/pressure, no shortness of breath at rest.  No shortness of breath with exertion.  Denies history of atrial  fibrillation or irregular heartbeat  Vascular: No history of rest pain in feet.  No history of claudication.  No history of non-healing ulcer, No history of DVT   Pulmonary: No home oxygen, no productive cough, no hemoptysis,  No asthma or wheezing  Musculoskeletal:  '[ ]'$  Arthritis, '[ ]'$  Low back pain,  '[ ]'$  Joint pain  Hematologic:No history of hypercoagulable state.  No history of easy bleeding.  No history of anemia  Gastrointestinal: No hematochezia or melena,  No gastroesophageal reflux, no trouble swallowing  Urinary: '[ ]'$  chronic Kidney disease, '[ ]'$  on HD - '[ ]'$  MWF or '[ ]'$  TTHS, '[ ]'$  Burning with urination, '[ ]'$  Frequent urination, '[ ]'$  Difficulty urinating;   Skin: No rashes  Psychological: No history of anxiety,  No history of depression   Physical Examination  Vitals:   11/09/20 1017  BP: (!) 151/76  Pulse: 76  Resp: 20  Temp: 97.7 F (36.5 C)  SpO2: 96%  Weight: 199 lb (90.3 kg)  Height: '5\' 4"'$  (1.626 m)    Body mass index is 34.16 kg/m.  General:  Alert and oriented, no acute distress HEENT: Normal Neck: No JVD Cardiac: Regular Rate and Rhythm  Skin: No rash Extremity Pulses:  2+ radial, brachial, femoral, absent bilateral dorsalis pedis, 2+ right absent left posterior tibial pulses Musculoskeletal: No deformity or edema  Neurologic: Upper and lower extremity motor 5/5 and symmetric  DATA:  Patient had bilateral ABIs performed today.  They were greater than 1 and normal bilaterally.  He did have monophasic flow in his left posterior tibial artery suggestive of some mild tibial disease.  Toe pressure was greater than 100 bilaterally.  This is normal.  ASSESSMENT: Patient's symptoms most likely secondary to peripheral neuropathy.  He has normal noninvasive arterial exam.  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   PLAN: Patient will follow up on an as-needed basis.  I told him he could  have a discussion with his primary care provider regarding whether or not he would benefit from Lyrica or Neurontin for his peripheral neuropathy.   Ruta Hinds, MD [facility] Office: [phone]

## 2021-01-10 DIAGNOSIS — K219 Gastro-esophageal reflux disease without esophagitis: Secondary | ICD-10-CM | POA: Diagnosis not present

## 2021-01-10 DIAGNOSIS — E782 Mixed hyperlipidemia: Secondary | ICD-10-CM | POA: Diagnosis not present

## 2021-01-10 DIAGNOSIS — E669 Obesity, unspecified: Secondary | ICD-10-CM | POA: Diagnosis not present

## 2021-01-10 DIAGNOSIS — I10 Essential (primary) hypertension: Secondary | ICD-10-CM | POA: Diagnosis not present

## 2021-01-10 DIAGNOSIS — G629 Polyneuropathy, unspecified: Secondary | ICD-10-CM | POA: Diagnosis not present

## 2021-01-10 DIAGNOSIS — R5383 Other fatigue: Secondary | ICD-10-CM | POA: Diagnosis not present

## 2021-03-23 DIAGNOSIS — H6121 Impacted cerumen, right ear: Secondary | ICD-10-CM | POA: Diagnosis not present

## 2021-04-05 DIAGNOSIS — I10 Essential (primary) hypertension: Secondary | ICD-10-CM | POA: Diagnosis not present

## 2021-04-05 DIAGNOSIS — K219 Gastro-esophageal reflux disease without esophagitis: Secondary | ICD-10-CM | POA: Diagnosis not present

## 2021-04-05 DIAGNOSIS — E782 Mixed hyperlipidemia: Secondary | ICD-10-CM | POA: Diagnosis not present

## 2021-04-05 DIAGNOSIS — R5383 Other fatigue: Secondary | ICD-10-CM | POA: Diagnosis not present

## 2021-04-05 DIAGNOSIS — E669 Obesity, unspecified: Secondary | ICD-10-CM | POA: Diagnosis not present

## 2021-04-05 DIAGNOSIS — G629 Polyneuropathy, unspecified: Secondary | ICD-10-CM | POA: Diagnosis not present

## 2021-10-09 DIAGNOSIS — R7303 Prediabetes: Secondary | ICD-10-CM | POA: Diagnosis not present

## 2021-10-09 DIAGNOSIS — Z0000 Encounter for general adult medical examination without abnormal findings: Secondary | ICD-10-CM | POA: Diagnosis not present

## 2021-10-09 DIAGNOSIS — E782 Mixed hyperlipidemia: Secondary | ICD-10-CM | POA: Diagnosis not present

## 2021-10-09 DIAGNOSIS — G629 Polyneuropathy, unspecified: Secondary | ICD-10-CM | POA: Diagnosis not present

## 2021-10-09 DIAGNOSIS — I10 Essential (primary) hypertension: Secondary | ICD-10-CM | POA: Diagnosis not present

## 2021-10-09 DIAGNOSIS — Z125 Encounter for screening for malignant neoplasm of prostate: Secondary | ICD-10-CM | POA: Diagnosis not present

## 2021-10-09 DIAGNOSIS — K219 Gastro-esophageal reflux disease without esophagitis: Secondary | ICD-10-CM | POA: Diagnosis not present

## 2021-10-09 DIAGNOSIS — R5383 Other fatigue: Secondary | ICD-10-CM | POA: Diagnosis not present

## 2021-10-09 DIAGNOSIS — E669 Obesity, unspecified: Secondary | ICD-10-CM | POA: Diagnosis not present

## 2021-11-27 DIAGNOSIS — E669 Obesity, unspecified: Secondary | ICD-10-CM | POA: Diagnosis not present

## 2021-11-27 DIAGNOSIS — R5383 Other fatigue: Secondary | ICD-10-CM | POA: Diagnosis not present

## 2021-11-27 DIAGNOSIS — G629 Polyneuropathy, unspecified: Secondary | ICD-10-CM | POA: Diagnosis not present

## 2021-11-27 DIAGNOSIS — R7303 Prediabetes: Secondary | ICD-10-CM | POA: Diagnosis not present

## 2021-11-27 DIAGNOSIS — E782 Mixed hyperlipidemia: Secondary | ICD-10-CM | POA: Diagnosis not present

## 2021-11-27 DIAGNOSIS — I10 Essential (primary) hypertension: Secondary | ICD-10-CM | POA: Diagnosis not present

## 2021-11-27 DIAGNOSIS — K219 Gastro-esophageal reflux disease without esophagitis: Secondary | ICD-10-CM | POA: Diagnosis not present

## 2021-12-31 ENCOUNTER — Ambulatory Visit (INDEPENDENT_AMBULATORY_CARE_PROVIDER_SITE_OTHER): Payer: Medicare Other | Admitting: Neurology

## 2021-12-31 ENCOUNTER — Encounter: Payer: Self-pay | Admitting: Neurology

## 2021-12-31 VITALS — BP 190/107 | HR 85 | Ht 64.0 in | Wt 203.6 lb

## 2021-12-31 DIAGNOSIS — E519 Thiamine deficiency, unspecified: Secondary | ICD-10-CM | POA: Diagnosis not present

## 2021-12-31 DIAGNOSIS — R269 Unspecified abnormalities of gait and mobility: Secondary | ICD-10-CM

## 2021-12-31 DIAGNOSIS — R200 Anesthesia of skin: Secondary | ICD-10-CM | POA: Diagnosis not present

## 2021-12-31 DIAGNOSIS — R7303 Prediabetes: Secondary | ICD-10-CM | POA: Diagnosis not present

## 2021-12-31 DIAGNOSIS — G959 Disease of spinal cord, unspecified: Secondary | ICD-10-CM | POA: Diagnosis not present

## 2021-12-31 DIAGNOSIS — R5383 Other fatigue: Secondary | ICD-10-CM | POA: Diagnosis not present

## 2021-12-31 DIAGNOSIS — R29898 Other symptoms and signs involving the musculoskeletal system: Secondary | ICD-10-CM

## 2021-12-31 DIAGNOSIS — M48062 Spinal stenosis, lumbar region with neurogenic claudication: Secondary | ICD-10-CM | POA: Diagnosis not present

## 2021-12-31 DIAGNOSIS — E538 Deficiency of other specified B group vitamins: Secondary | ICD-10-CM

## 2021-12-31 DIAGNOSIS — E531 Pyridoxine deficiency: Secondary | ICD-10-CM

## 2021-12-31 DIAGNOSIS — R270 Ataxia, unspecified: Secondary | ICD-10-CM

## 2021-12-31 MED ORDER — GABAPENTIN 300 MG PO CAPS: ORAL_CAPSULE | ORAL | 11 refills | Status: AC

## 2021-12-31 NOTE — Patient Instructions (Signed)
Blood work today MRI cervical and lumbar spine Gabapentin for pain in the feet If negative worjup, repeat emg/ncs  Gabapentin Capsules or Tablets What is this medication? GABAPENTIN (GA ba pen tin) treats nerve pain. It may also be used to prevent and control seizures in people with epilepsy. It works by calming overactive nerves in your body. This medicine may be used for other purposes; ask your health care provider or pharmacist if you have questions. COMMON BRAND NAME(S): Active-PAC with Gabapentin, Orpha Bur, Gralise, Neurontin What should I tell my care team before I take this medication? They need to know if you have any of these conditions: Alcohol or substance use disorder Kidney disease Lung or breathing disease Suicidal thoughts, plans, or attempt; a previous suicide attempt by you or a family member An unusual or allergic reaction to gabapentin, other medications, foods, dyes, or preservatives Pregnant or trying to get pregnant Breast-feeding How should I use this medication? Take this medication by mouth with a glass of water. Follow the directions on the prescription label. You can take it with or without food. If it upsets your stomach, take it with food. Take your medication at regular intervals. Do not take it more often than directed. Do not stop taking except on your care team's advice. If you are directed to break the 600 or 800 mg tablets in half as part of your dose, the extra half tablet should be used for the next dose. If you have not used the extra half tablet within 28 days, it should be thrown away. A special MedGuide will be given to you by the pharmacist with each prescription and refill. Be sure to read this information carefully each time. Talk to your care team about the use of this medication in children. While this medication may be prescribed for children as young as 3 years for selected conditions, precautions do apply. Overdosage: If you think you have  taken too much of this medicine contact a poison control center or emergency room at once. NOTE: This medicine is only for you. Do not share this medicine with others. What if I miss a dose? If you miss a dose, take it as soon as you can. If it is almost time for your next dose, take only that dose. Do not take double or extra doses. What may interact with this medication? Alcohol Antihistamines for allergy, cough, and cold Certain medications for anxiety or sleep Certain medications for depression like amitriptyline, fluoxetine, sertraline Certain medications for seizures like phenobarbital, primidone Certain medications for stomach problems General anesthetics like halothane, isoflurane, methoxyflurane, propofol Local anesthetics like lidocaine, pramoxine, tetracaine Medications that relax muscles for surgery Opioid medications for pain Phenothiazines like chlorpromazine, mesoridazine, prochlorperazine, thioridazine This list may not describe all possible interactions. Give your health care provider a list of all the medicines, herbs, non-prescription drugs, or dietary supplements you use. Also tell them if you smoke, drink alcohol, or use illegal drugs. Some items may interact with your medicine. What should I watch for while using this medication? Visit your care team for regular checks on your progress. You may want to keep a record at home of how you feel your condition is responding to treatment. You may want to share this information with your care team at each visit. You should contact your care team if your seizures get worse or if you have any new types of seizures. Do not stop taking this medication or any of your seizure medications unless instructed by  your care team. Stopping your medication suddenly can increase your seizures or their severity. This medication may cause serious skin reactions. They can happen weeks to months after starting the medication. Contact your care team  right away if you notice fevers or flu-like symptoms with a rash. The rash may be red or purple and then turn into blisters or peeling of the skin. Or, you might notice a red rash with swelling of the face, lips or lymph nodes in your neck or under your arms. Wear a medical identification bracelet or chain if you are taking this medication for seizures. Carry a card that lists all your medications. This medication may affect your coordination, reaction time, or judgment. Do not drive or operate machinery until you know how this medication affects you. Sit up or stand slowly to reduce the risk of dizzy or fainting spells. Drinking alcohol with this medication can increase the risk of these side effects. Your mouth may get dry. Chewing sugarless gum or sucking hard candy, and drinking plenty of water may help. Watch for new or worsening thoughts of suicide or depression. This includes sudden changes in mood, behaviors, or thoughts. These changes can happen at any time but are more common in the beginning of treatment or after a change in dose. Call your care team right away if you experience these thoughts or worsening depression. If you become pregnant while using this medication, you may enroll in the [place] Pregnancy Registry by calling [phone]. This registry collects information about the safety of antiepileptic medication use during pregnancy. What side effects may I notice from receiving this medication? Side effects that you should report to your care team as soon as possible: Allergic reactions or angioedema--skin rash, itching, hives, swelling of the face, eyes, lips, tongue, arms, or legs, trouble swallowing or breathing Rash, fever, and swollen lymph nodes Thoughts of suicide or self harm, worsening mood, feelings of depression Trouble breathing Unusual changes in mood or behavior in children after use such as difficulty concentrating, hostility, or  restlessness Side effects that usually do not require medical attention (report to your care team if they continue or are bothersome): Dizziness Drowsiness Nausea Swelling of ankles, feet, or hands Vomiting This list may not describe all possible side effects. Call your doctor for medical advice about side effects. You may report side effects to FDA at 1-800-FDA-1088. Where should I keep my medication? Keep out of reach of children and pets. Store at room temperature between 15 and 30 degrees C (59 and 86 degrees F). Get rid of any unused medication after the expiration date. This medication may cause accidental overdose and death if taken by other adults, children, or pets. To get rid of medications that are no longer needed or have expired: Take the medication to a medication take-back program. Check with your pharmacy or law enforcement to find a location. If you cannot return the medication, check the label or package insert to see if the medication should be thrown out in the garbage or flushed down the toilet. If you are not sure, ask your care team. If it is safe to put it in the trash, empty the medication out of the container. Mix the medication with cat litter, dirt, coffee grounds, or other unwanted substance. Seal the mixture in a bag or container. Put it in the trash. NOTE: This sheet is a summary. It may not cover all possible information. If you have questions about this medicine, talk to  your doctor, pharmacist, or health care provider.  2023 Elsevier/Gold Standard (2020-04-11 00:00:00)

## 2021-12-31 NOTE — Progress Notes (Signed)
[facility]    Provider:  Dr Jaynee Eagles Requesting Provider: Trey Sailors, PA Primary Care Provider:  Trey Sailors, PA  CC:  Bilateral leg pain  12/31/2021: Patient seen in the past for leg pain and burning in the feet and leg pain after surgery for decompression of very severe spinal stenosis 2018 . Discussed findings of emg/ncs in 2021: Chronic changes L5/s1. Also mild sensory polyneuropathy.(Addendum: HgbA1c 6.0, prediabetes which can cause sensory neuropathy, follow up with primary care). Patient with residual leg pain after surgery for decompression of very severe spinal stenosis 2018 L4/L5. Today back for same. Interpreter on the phone, Interpreter is SNOW. Also here with father in law.No fhx of autoimmune disease or hereditary nerve disease.   Patient seen in the past for leg pain and burning in the feet and leg pain after surgery for decompression of very severe spinal stenosis 2018. Symptoms are in the feet, still burning in the feet on the soles of the feet and numbness. Getting worse, slowly worse. Feels it the most at night time in bed about 7 pm until 12-1am then feels better. Started after the back surgery. Left foot is worse but right is included as well. He has some weakness in his leg and he is not sure when he had surgery,  gait abnormality, also weakness of the arm with signiciant changes in the c-spine with myelopathy on mri c-spine 2021 and he is unclear of cervical surgery after or before 2021 MRi c-spine showing significant changes. Left arm is weak. No other focal neurologic deficits, associated symptoms, inciting events or modifiable factors.  Personally reviewed images and agree.   No neck pain or radiculopathy, but feels weakness in the left arm.  IMPRESSION: 2021 : had surgery to correct? 1. Advanced multilevel degenerative changes of the cervical spine as described above. Moderate spinal canal stenosis at C3-C4 and C4-C5. Severe  neuroforaminal stenosis from C3-C4 through C6-C7. 2. Focal increased signal in the right hemicord at C4, likely representing myelomalacia.  MRI lumbar spine: CLINICAL DATA:  Chronic bilateral leg pain and weakness, not improved after back surgery in 2018. 12/29/2019   EXAM: MRI LUMBAR SPINE WITHOUT CONTRAST   TECHNIQUE: Multiplanar, multisequence MR imaging of the lumbar spine was performed. No intravenous contrast was administered.   COMPARISON:  CT lumbar spine dated December 20, 2016.   FINDINGS: Segmentation:  Standard.   Alignment: Unchanged trace retrolisthesis from L1-L2 through L3-L4. Unchanged mild anterolisthesis at L4-L5.   Vertebrae:  No fracture, evidence of discitis, or bone lesion.   Conus medullaris and cauda equina: Conus extends to the L1 level. Conus and cauda equina appear normal.   Paraspinal and other soft tissues: Negative.   Disc levels:   Congenitally short pedicles.   T12-L1: No significant disc bulge or herniation. Mild bilateral facet arthropathy. No stenosis.   L1-L2: Mild disc bulging and bilateral facet arthropathy. Mild bilateral neuroforaminal stenosis. No spinal canal stenosis.   L2-L3: Mild disc bulging and bilateral facet arthropathy. Mild spinal canal and bilateral neuroforaminal stenosis.   L3-L4: Mild disc bulging and bilateral facet arthropathy. Moderate spinal canal stenosis. Mild to moderate bilateral neuroforaminal stenosis.   L4-L5: Interval posterior decompression and PLIF. No residual stenosis.   L5-S1: Interval posterior decompression and PLIF. No residual stenosis.   IMPRESSION: 1. Interval L4-S1 posterior decompression and PLIF without residual stenosis. 2. Multilevel lumbar spondylosis as described above, worst at L3-L4 where there is moderate spinal canal stenosis. Findings are similar to prior CT.  HPI:  Steven Ross is a 66 y.o. male here as requested by Trey Sailors, PA for evaluation for EMG nerve  conduction study by spine and scoliosis specialists.  Patient has low back pain, severity 8 out of 10, stable, occurs persistently, lower back, knees and lower legs, symptoms are aggravated by walking, no relieving factors, he has a past medical history of chronic joint pain, abnormal gait, lumbar spine ankylosis, lumbar fusion, he is on high blood pressure medication.  He complains of pain, numbness, tingling, burning in the bilateral lower extremities from the knees to the bottom of the feet, no pain in thighs or neck, onset was in 2015, worsens with prolonged standing and walking but sitting does not alleviate the pain at all, using a cane due to instability while walking, history L4-S1 by Dr. Cyndy Freeze in 2015 due to very severe L4-L5 central canal stenosis (see MRI 2018 on canopy), patient reports that all his symptoms were present preop, were unchanged postop, and continue to gradually worsen, he fell out of a tree in 1986 with injury to the back at that time.  He is here with family and an interpreter Humberto Leep. After surgery he felt better, now he has pain from the lateral side of the knee to the foot. This was there before surgery and never improved. His back is a lot better. Only right leg. He can hardly stand up. The left leg a little bit mostly the right leg, he can't wiggle his toes and burns and numbness. From the lateral knee to the foot. Gabapentin doesn't help. Hurts worse when he walks. He has weakness of dorsiflexion right foot. No other focal neurologic deficits, associated symptoms, inciting events or modifiable factors.  Patient complains of symptoms per HPI as well as the following symptoms: leg and left arm weakness . Pertinent negatives and positives per HPI. All others negative  Reviewed notes, labs and imaging from outside physicians, which showed:  MRI lumbar spine 12/04/2016:  Disc levels:  L1-2: Small broad-based disc bulge without neural impingement. Minimal hypertrophy of the  facet joints.  L2-3: Minimal disc bulging into the neural foramina without neural impingement. Slight hypertrophy of the facet joints and ligamentum flavum.  L3-4: Slight disc bulging into the neural foramina without neural impingement. Congenitally short pedicles with slight hypertrophy of the ligamentum flavum and facet joints combine to create mild spinal stenosis.  L4-5: Almost complete obliteration of the thecal sac due to marked hypertrophy of the ligamentum flavum and facet joints and congenitally short pedicles as well as a 2 mm spondylolisthesis and a broad-based bulge of the uncovered disc with small disc protrusions into both neural foramina. This has progressed since the prior study of 2015.  L5-S1: Minimal broad-based disc bulge asymmetric into the left neural foramen. Slight hypertrophy of the ligamentum flavum facet joints with narrowing of the left lateral recess which could affect the left S1 nerve. Moderate spinal stenosis. Congenitally short pedicles.  IMPRESSION: personally reviewed images and agree 1. Very severe spinal stenosis at L4-5 with almost complete obliteration of the thecal sac, primarily due to posterior element hypertrophy and congenitally short pedicles. 2. Moderate spinal stenosis at L5-S1. 3. Mild spinal stenosis at L3-4   MRI 2021:  Disc levels:   Congenitally short pedicles.   T12-L1: No significant disc bulge or herniation. Mild bilateral facet arthropathy. No stenosis.   L1-L2: Mild disc bulging and bilateral facet arthropathy. Mild bilateral neuroforaminal stenosis. No spinal canal stenosis.   L2-L3: Mild disc  bulging and bilateral facet arthropathy. Mild spinal canal and bilateral neuroforaminal stenosis.   L3-L4: Mild disc bulging and bilateral facet arthropathy. Moderate spinal canal stenosis. Mild to moderate bilateral neuroforaminal stenosis.   L4-L5: Interval posterior decompression and PLIF. No residual stenosis.    L5-S1: Interval posterior decompression and PLIF. No residual stenosis.   IMPRESSION: 1. Interval L4-S1 posterior decompression and PLIF without residual stenosis. 2. Multilevel lumbar spondylosis as described above, worst at L3-L4 where there is moderate spinal canal stenosis. Findings are similar to prior CT.  Review of Systems: Patient complains of symptoms per HPI as well as the following symptoms: leg pain. Pertinent negatives and positives per HPI. All others negative.   Social History   Socioeconomic History   Marital status: Married    Spouse name: Not on file   Number of children: Not on file   Years of education: Not on file   Highest education level: Not on file  Occupational History   Not on file  Tobacco Use   Smoking status: Former    Types: Cigarettes   Smokeless tobacco: Never   Tobacco comments:    quit smoking cigarettes in 2012  Vaping Use   Vaping Use: Never used  Substance and Sexual Activity   Alcohol use: No   Drug use: No   Sexual activity: Yes    Birth control/protection: None    Comment: last intercourse 3 weeks ago  Other Topics Concern   Not on file  Social History Narrative   Patient lives at home with his wife and daughter.   Social Determinants of Health   Financial Resource Strain: Not on file  Food Insecurity: Not on file  Transportation Needs: Not on file  Physical Activity: Not on file  Stress: Not on file  Social Connections: Not on file  Intimate Partner Violence: Not on file    Family History  Adopted: Yes  Problem Relation Age of Onset   Neuropathy Neg Hx     Past Medical History:  Diagnosis Date   Chronic joint pain    Headache    Hypertension    Radiculopathy, lumbosacral region 2012   Spondylolisthesis of lumbosacral region     Patient Active Problem List   Diagnosis Date Noted   Spondylolisthesis of lumbosacral region 12/27/2016    Past Surgical History:  Procedure Laterality Date   ABSCESS  DRAINAGE     ANTERIOR LAT LUMBAR FUSION N/A 12/27/2016   Procedure: Lumbar four-five Transpsoas lumbar interbody fusion with Lumbar five-Sacrum one  Posterior lumbar interbody fusion, Lumbar four-five  Gill procedure, Lumbar five-Sacrum one Laminectomy for decompression, Lumbar four-Sacrum one cortical trajectory screw fixation and fusion with Mazor;  Surgeon: Ditty, Kevan Ny, MD;  Location: [facility];  Service: Neurosurgery;  Laterality: N/A;   APPLICATION OF ROBOTIC ASSISTANCE FOR SPINAL PROCEDURE N/A 12/27/2016   Procedure: APPLICATION OF ROBOTIC ASSISTANCE FOR SPINAL PROCEDURE;  Surgeon: Ditty, Kevan Ny, MD;  Location: [facility];  Service: Neurosurgery;  Laterality: N/A;    Current Outpatient Medications  Medication Sig Dispense Refill   gabapentin (NEURONTIN) 300 MG capsule Start with one pill 3x a day. And if it doesn't help increae to 2 pills 3x a day. 180 capsule 11   ibuprofen (ADVIL) 800 MG tablet 1 tab(s)     lisinopril (ZESTRIL) 20 MG tablet Take 20 mg by mouth daily.     No current facility-administered medications for this visit.    Allergies as of 12/31/2021   (No Known Allergies)  Vitals: BP (!) 190/107 Comment (BP Location): left arm  Pulse 85   Ht _0  (1.626 m)   Wt 203 lb 9.6 oz (92.4 kg)   BMI 34.95 kg/m  Last Weight:  Wt Readings from Last 1 Encounters:  12/31/21 203 lb 9.6 oz (92.4 kg)   Last Height:   Ht Readings from Last 1 Encounters:  12/31/21 _1  (1.626 m)   Exam: NAD, pleasant                  Speech:    Speech is normal; fluent and spontaneous with normal comprehension.  Cognition:    The patient is oriented to person, place, and time;     recent and remote memory intact;     language fluent;    Cranial Nerves:    The pupils are equal, round, and reactive to light.Trigeminal sensation is intact and the muscles of mastication are normal. The face is symmetric. The palate elevates in the midline. Hearing intact. Voice is normal. Shoulder  shrug is normal. The tongue has normal motion without fasciculations.   Coordination:  No dysmetria  Motor Observation:    No asymmetry, no atrophy, and no involuntary movements noted. Tone:    Normal muscle tone.     Strength:    Strength is V/V in the upper and lower limbs.      Sensation: intact to LT, pin prick distally decreased in the toes, decreased temp in the toes in a gradient, absent proprioception in the toes, absent vibration to the medial malleoli  Reflexes, absent AJs, otherwise left hyperreflexic, right 1+ otherwise  Gait: Ataxic, wide based  Assessment/Plan:  Patient with residual leg pain after surgery for decompression of very severe spinal stenosis 2018 L4/L5. Also Ataxia, cervical stenosis with myelopathy. Worked up in the past for for leg pain and burning in the feet and leg pain after surgery for decompression of very severe spinal stenosis 2018 . Discussed findings of emg/ncs in 2021: Chronic changes L5/s1. Also mild sensory polyneuropathy.(HgbA1c 6.0, prediabetes which can cause sensory neuropathy. Patient here again today still with residual leg pain after surgery for decompression of very severe spinal stenosis 2018 L4/L5. Interpreter on the phone, also father in law here for help with interpretation. No fhx of autoimmune disease or hereditary nerve disease. Symptoms are in the feet, still burning in the feet on the soles of the feet and numbness. Started after the back surgery. Left foot is worse but right is included as well. He has more weakness in his legs and he reports weakness in arm.   He has  gait abnormality, also weakness of the arm with signiciant changes in the c-spine with myelopathy on mri c-spine 2021 and he is unclear of cervical surgery after or before 2021 MRi c-spine showing significant changes. Left arm is weak. Repeat mri cervical spine.   Discussed with patient that weakness and sensory changes may persist despite surgical intervention. Unclear  if this is remote residual injury from his severe stenosis  back or if anything is currently ongoing (ie new radic or steonosis); mri lumbar spine  Blood work today for other causes of numbness in feet such as diabetic neuropathy MRI cervical and lumbar spine as above Gabapentin for pain in the feet: try lyrica next for pain If negative workup, repeat emg/ncs  No neck pain or radiculopathy, but feels weakness in the left arm.  IMPRESSION: 2021 : had surgery to correct? 1. Advanced multilevel degenerative changes of the  cervical spine as described above. Moderate spinal canal stenosis at C3-C4 and C4-C5. Severe neuroforaminal stenosis from C3-C4 through C6-C7. 2. Focal increased signal in the right hemicord at C4, likely representing myelomalacia.  MRI lumbar spine: CLINICAL DATA:  Chronic bilateral leg pain and weakness, not improved after back surgery in 2018. 12/29/2019   EXAM: MRI LUMBAR SPINE WITHOUT CONTRAST   TECHNIQUE: Multiplanar, multisequence MR imaging of the lumbar spine was performed. No intravenous contrast was administered.   COMPARISON:  CT lumbar spine dated December 20, 2016.   FINDINGS: Segmentation:  Standard.   Alignment: Unchanged trace retrolisthesis from L1-L2 through L3-L4. Unchanged mild anterolisthesis at L4-L5.   Vertebrae:  No fracture, evidence of discitis, or bone lesion.   Conus medullaris and cauda equina: Conus extends to the L1 level. Conus and cauda equina appear normal.   Paraspinal and other soft tissues: Negative.   Disc levels:   Congenitally short pedicles.   T12-L1: No significant disc bulge or herniation. Mild bilateral facet arthropathy. No stenosis.   L1-L2: Mild disc bulging and bilateral facet arthropathy. Mild bilateral neuroforaminal stenosis. No spinal canal stenosis.   L2-L3: Mild disc bulging and bilateral facet arthropathy. Mild spinal canal and bilateral neuroforaminal stenosis.   L3-L4: Mild disc bulging and  bilateral facet arthropathy. Moderate spinal canal stenosis. Mild to moderate bilateral neuroforaminal stenosis.   L4-L5: Interval posterior decompression and PLIF. No residual stenosis.   L5-S1: Interval posterior decompression and PLIF. No residual stenosis.   IMPRESSION: 1. Interval L4-S1 posterior decompression and PLIF without residual stenosis. 2. Multilevel lumbar spondylosis as described above, worst at L3-L4 where there is moderate spinal canal stenosis. Findings are similar to prior CT.  Orders Placed This Encounter  Procedures   MR CERVICAL SPINE WO CONTRAST   MR LUMBAR SPINE WO CONTRAST   Hemoglobin A1c   B12 and Folate Panel   Methylmalonic acid, serum   Vitamin B1   RPR   Comprehensive metabolic panel   CBC with Differential/Platelets   TSH   Multiple Myeloma Panel (SPEP&IFE w/QIG)   Angiotensin converting enzyme   Sjogren's syndrome antibods(ssa + ssb)   ANA w/Reflex   Hepatitis C antibody   Rheumatoid factor   Vitamin B6   Meds ordered this encounter  Medications   gabapentin (NEURONTIN) 300 MG capsule    Sig: Start with one pill 3x a day. And if it doesn't help increae to 2 pills 3x a day.    Dispense:  180 capsule    Refill:  11    Cc: Trey Sailors, PA,  [place], [place], [place]  Sarina Ill, MD  [facility] [address] [place], [place] [address]  Phone [phone] Fax [phone]  I spent 70 minutes of face-to-face and non-face-to-face time with patient on the  1. Weakness of both lower extremities   2. Ataxia   3. Gait abnormality   4. Numbness of left foot   5. Cervical myelopathy ([place])   6. Numbness of right foot   7. Spinal stenosis of lumbar region with neurogenic claudication   8. screen for B12 deficiency   9. Pre-diabetes   10. screening for Vitamin B1 deficiency   11. Other fatigue   12. screen for Vitamin B6 deficiency   13. Left arm weakness    diagnosis.  This included  previsit chart review, lab review, study review, order entry, electronic health record documentation, patient education on the different diagnostic and therapeutic options, counseling and coordination of care,  risks and benefits of management, compliance, or risk factor reduction

## 2022-01-01 ENCOUNTER — Telehealth: Payer: Self-pay | Admitting: Neurology

## 2022-01-01 NOTE — Telephone Encounter (Signed)
medicare NPR sent to GI

## 2022-01-04 LAB — MULTIPLE MYELOMA PANEL, SERUM

## 2022-01-04 LAB — COMPREHENSIVE METABOLIC PANEL
AST: 23 IU/L (ref 0–40)
Albumin: 4.5 g/dL (ref 3.9–4.9)
BUN/Creatinine Ratio: 20 (ref 10–24)
Bilirubin Total: 0.4 mg/dL (ref 0.0–1.2)
CO2: 26 mmol/L (ref 20–29)
Chloride: 103 mmol/L (ref 96–106)
Total Protein: 7.6 g/dL (ref 6.0–8.5)
eGFR: 102 mL/min/{1.73_m2} (ref >59)

## 2022-01-04 LAB — HEPATITIS C ANTIBODY: Hep C Virus Ab: NONREACTIVE

## 2022-01-04 LAB — VITAMIN B1: Thiamine: 94.7 nmol/L (ref 66.5–200.0)

## 2022-01-04 LAB — ANGIOTENSIN CONVERTING ENZYME: Angio Convert Enzyme: 6 U/L — ABNORMAL LOW (ref 14–82)

## 2022-01-04 LAB — CBC WITH DIFFERENTIAL/PLATELET
EOS (ABSOLUTE): 0.4 10*3/uL (ref 0.0–0.4)
Hematocrit: 44.5 % (ref 37.5–51.0)
Lymphocytes Absolute: 2 10*3/uL (ref 0.7–3.1)
Lymphs: 28 %
MCHC: 32.6 g/dL (ref 31.5–35.7)
Monocytes Absolute: 0.5 10*3/uL (ref 0.1–0.9)
RDW: 13.1 % (ref 11.6–15.4)
WBC: 7.1 10*3/uL (ref 3.4–10.8)

## 2022-01-04 LAB — B12 AND FOLATE PANEL
Folate: 9.2 ng/mL (ref >3.0)
Vitamin B-12: 916 pg/mL (ref 232–1245)

## 2022-01-04 LAB — RPR: RPR Ser Ql: NONREACTIVE

## 2022-01-04 LAB — HEMOGLOBIN A1C: Hgb A1c MFr Bld: 6 % — ABNORMAL HIGH (ref 4.8–5.6)

## 2022-01-04 LAB — METHYLMALONIC ACID, SERUM: Methylmalonic Acid: 99 nmol/L (ref 0–378)

## 2022-01-04 LAB — TSH: TSH: 1.03 u[IU]/mL (ref 0.450–4.500)

## 2022-01-07 LAB — ANA W/REFLEX: Anti Nuclear Antibody (ANA): NEGATIVE

## 2022-01-07 LAB — MULTIPLE MYELOMA PANEL, SERUM
Alpha 1: 0.2 g/dL (ref 0.0–0.4)
Alpha2 Glob SerPl Elph-Mcnc: 0.7 g/dL (ref 0.4–1.0)
B-Globulin SerPl Elph-Mcnc: 1.2 g/dL (ref 0.7–1.3)
Gamma Glob SerPl Elph-Mcnc: 1.7 g/dL (ref 0.4–1.8)
IgG (Immunoglobin G), Serum: 1865 mg/dL — ABNORMAL HIGH (ref 603–1613)

## 2022-01-07 LAB — COMPREHENSIVE METABOLIC PANEL
ALT: 25 IU/L (ref 0–44)
Albumin/Globulin Ratio: 1.5 (ref 1.2–2.2)
Alkaline Phosphatase: 90 IU/L (ref 44–121)
BUN: 14 mg/dL (ref 8–27)
Calcium: 9 mg/dL (ref 8.6–10.2)
Creatinine, Ser: 0.69 mg/dL — ABNORMAL LOW (ref 0.76–1.27)
Globulin, Total: 3.1 g/dL (ref 1.5–4.5)
Glucose: 62 mg/dL — ABNORMAL LOW (ref 70–99)
Potassium: 4.8 mmol/L (ref 3.5–5.2)
Sodium: 141 mmol/L (ref 134–144)

## 2022-01-07 LAB — CBC WITH DIFFERENTIAL/PLATELET
Basophils Absolute: 0.1 10*3/uL (ref 0.0–0.2)
Basos: 1 %
Eos: 6 %
Hemoglobin: 14.5 g/dL (ref 13.0–17.7)
Immature Grans (Abs): 0 10*3/uL (ref 0.0–0.1)
Immature Granulocytes: 0 %
MCH: 29.4 pg (ref 26.6–33.0)
MCV: 90 fL (ref 79–97)
Monocytes: 7 %
Neutrophils Absolute: 4.2 10*3/uL (ref 1.4–7.0)
Neutrophils: 58 %
Platelets: 187 10*3/uL (ref 150–450)
RBC: 4.93 x10E6/uL (ref 4.14–5.80)

## 2022-01-07 LAB — RHEUMATOID FACTOR: Rhuematoid fact SerPl-aCnc: <10 IU/mL (ref <14.0)

## 2022-01-07 LAB — HEMOGLOBIN A1C: Est. average glucose Bld gHb Est-mCnc: 126 mg/dL

## 2022-01-07 LAB — SJOGREN'S SYNDROME ANTIBODS(SSA + SSB)
ENA SSA (RO) Ab: <0.2 AI (ref 0.0–0.9)
ENA SSB (LA) Ab: <0.2 AI (ref 0.0–0.9)

## 2022-01-07 LAB — VITAMIN B6: Vitamin B6: 6.5 ug/L (ref 3.4–65.2)

## 2022-01-07 NOTE — Progress Notes (Signed)
Patient is prediabetic, unfortunately this can be a cause for neuropathy in the feet even though it is just pre-diabetes. I don;t see any other causes. His B6 is a little low, we can try a prescription medication with B6 in it foltx if he would like to try it. I don;t see any other cause other than his back issues. Still awaiting repeat mri c-spine and l-spine. . thanks

## 2022-01-08 DIAGNOSIS — I10 Essential (primary) hypertension: Secondary | ICD-10-CM | POA: Diagnosis not present

## 2022-01-08 DIAGNOSIS — K219 Gastro-esophageal reflux disease without esophagitis: Secondary | ICD-10-CM | POA: Diagnosis not present

## 2022-01-08 DIAGNOSIS — E669 Obesity, unspecified: Secondary | ICD-10-CM | POA: Diagnosis not present

## 2022-01-08 DIAGNOSIS — G629 Polyneuropathy, unspecified: Secondary | ICD-10-CM | POA: Diagnosis not present

## 2022-01-08 DIAGNOSIS — R7303 Prediabetes: Secondary | ICD-10-CM | POA: Diagnosis not present

## 2022-01-08 DIAGNOSIS — E782 Mixed hyperlipidemia: Secondary | ICD-10-CM | POA: Diagnosis not present

## 2022-01-08 DIAGNOSIS — R5383 Other fatigue: Secondary | ICD-10-CM | POA: Diagnosis not present

## 2022-01-13 ENCOUNTER — Ambulatory Visit
Admission: RE | Admit: 2022-01-13 | Discharge: 2022-01-13 | Disposition: A | Discharge status: Home / Self Care | Payer: Medicare Other | Source: Ambulatory Visit | Attending: Neurology | Admitting: Neurology

## 2022-01-13 DIAGNOSIS — R269 Unspecified abnormalities of gait and mobility: Secondary | ICD-10-CM

## 2022-01-13 DIAGNOSIS — R270 Ataxia, unspecified: Secondary | ICD-10-CM

## 2022-01-13 DIAGNOSIS — R200 Anesthesia of skin: Secondary | ICD-10-CM

## 2022-01-13 DIAGNOSIS — R29898 Other symptoms and signs involving the musculoskeletal system: Secondary | ICD-10-CM | POA: Diagnosis not present

## 2022-01-13 DIAGNOSIS — G959 Disease of spinal cord, unspecified: Secondary | ICD-10-CM

## 2022-01-13 DIAGNOSIS — M48062 Spinal stenosis, lumbar region with neurogenic claudication: Secondary | ICD-10-CM | POA: Diagnosis not present

## 2022-01-14 ENCOUNTER — Telehealth: Payer: Self-pay | Admitting: Neurology

## 2022-01-14 DIAGNOSIS — M4802 Spinal stenosis, cervical region: Secondary | ICD-10-CM

## 2022-01-14 DIAGNOSIS — E531 Pyridoxine deficiency: Secondary | ICD-10-CM

## 2022-01-14 NOTE — Telephone Encounter (Signed)
Also need to discuss this with patient:  Melvenia Beam, MD  01/07/2022  7:56 PM EDT Back to Top    Patient is prediabetic, unfortunately this can be a cause for neuropathy in the feet even though it is just pre-diabetes. I don;t see any other causes. His B6 is a little low, we can try a prescription medication with B6 in it foltx if he would like to try it. I don;t see any other cause other than his back issues. Still awaiting repeat mri c-spine and l-spine. . thanks

## 2022-01-14 NOTE — Telephone Encounter (Signed)
Patient has lots of arthritis in his neck causing multiple levels of pinched nerves and most concerning is worsening central canal compressive myelopathy in other words his spinal cord is being squeezed by arthritic changes.  I think patient needs to see a surgeon for an evaluation, we can send him to [facility] unless he has a relationship with a different neurosurgeon or orthopedist he would like to go see but he definitely needs to have an evaluation with a Psychologist, sport and exercise.  If he is amenable please refer him to [facility] or whoever he prefers or tell me and I can do that.  Compressive myelopathy of the spine can be very serious and it can damage the spinal cord and affect walking and sensory changes in all the limbs including the feet, progression can often cause significant weakness and even paralysis so I would highly suggest that he at least gets an evaluation from a surgeon.  If he agrees please refer him to [facility] for cervical myelopathy or let me know and I can place an order.  Thank you

## 2022-01-15 NOTE — Telephone Encounter (Signed)
I called [employer]. They did not have an interpreter available at that moment who speaks [place]. Will try to call again later. I called the pt to see if there is another dialect he speaks and he said there is not. He speaks a little bit of [place]. He said I could try to go over the results with him and he put his daughter Truman Hayward on the phone (even though she is not on the [facility]) who speaks [place]. His father in law (on [place]) was not home. We discussed the results to an extent but I am going to call back later when I can get an interpreter who can relay the severity of his stenosis as he had said he wanted to think about getting a referral to neurosurgery. She did say he would start a B6 prescription supplement like Foltx and understands pt's B6 is low.

## 2022-01-15 NOTE — Telephone Encounter (Signed)
Called [employer] again. They still do not have an available interpreter right now that speaks [place].

## 2022-01-16 NOTE — Telephone Encounter (Signed)
Checked with [employer]. I was told they do not service the language [place]. I spoke with [place] interpreter line and was given the number for [facility], the [place] interpreter. I called the patient on conference call and we discussed the results of his MRI cervical spine as well as his labs. He understand the severity and concerns for his stenosis and he is willing to be referred to [facility]. He had a lumbar surgery in 2018 by Dr Cyndy Freeze (who no longer works at [employer]). He will call our office if he hasn't heard from the neurosurgery office within 2 weeks. He also verbalized understanding of his lab results and is willing to be prescribed Foltx.

## 2022-01-16 NOTE — Addendum Note (Signed)
Addended by: Gildardo Griffes on: 01/16/2022 04:18 PM   Modules accepted: Orders

## 2022-01-17 ENCOUNTER — Telehealth: Payer: Self-pay | Admitting: Neurology

## 2022-01-17 MED ORDER — FOLIC ACID-VIT B6-VIT B12 2.5-25-1 MG PO TABS: 1.0000 | ORAL_TABLET | Freq: Every day | ORAL | 3 refills | Status: DC

## 2022-01-17 NOTE — Telephone Encounter (Signed)
Referral for neurosurgery sent to [facility] as requested by neurologist. [phone], [place]

## 2022-01-17 NOTE — Addendum Note (Signed)
Addended by: Gildardo Griffes on: 01/17/2022 01:14 PM   Modules accepted: Orders

## 2022-02-28 DIAGNOSIS — E782 Mixed hyperlipidemia: Secondary | ICD-10-CM | POA: Diagnosis not present

## 2022-02-28 DIAGNOSIS — K219 Gastro-esophageal reflux disease without esophagitis: Secondary | ICD-10-CM | POA: Diagnosis not present

## 2022-02-28 DIAGNOSIS — I10 Essential (primary) hypertension: Secondary | ICD-10-CM | POA: Diagnosis not present

## 2022-02-28 DIAGNOSIS — R7303 Prediabetes: Secondary | ICD-10-CM | POA: Diagnosis not present

## 2022-02-28 DIAGNOSIS — E669 Obesity, unspecified: Secondary | ICD-10-CM | POA: Diagnosis not present

## 2022-02-28 DIAGNOSIS — G629 Polyneuropathy, unspecified: Secondary | ICD-10-CM | POA: Diagnosis not present

## 2022-04-04 ENCOUNTER — Ambulatory Visit: Payer: Medicare Other | Admitting: Neurology

## 2022-05-16 ENCOUNTER — Other Ambulatory Visit: Payer: Self-pay | Admitting: Neurology

## 2022-05-16 DIAGNOSIS — E531 Pyridoxine deficiency: Secondary | ICD-10-CM

## 2022-05-21 ENCOUNTER — Telehealth: Payer: Self-pay | Admitting: Neurology

## 2022-05-21 ENCOUNTER — Encounter: Payer: Self-pay | Admitting: Neurology

## 2022-05-21 ENCOUNTER — Ambulatory Visit (INDEPENDENT_AMBULATORY_CARE_PROVIDER_SITE_OTHER): Payer: Medicare Other | Admitting: Neurology

## 2022-05-21 VITALS — BP 166/88 | HR 78 | Ht 64.0 in | Wt 204.0 lb

## 2022-05-21 DIAGNOSIS — M4802 Spinal stenosis, cervical region: Secondary | ICD-10-CM | POA: Diagnosis not present

## 2022-05-21 DIAGNOSIS — G992 Myelopathy in diseases classified elsewhere: Secondary | ICD-10-CM | POA: Diagnosis not present

## 2022-05-21 NOTE — Telephone Encounter (Signed)
I spoke with [facility] and confirmed he is ok with Wed 2/7 at 11 am. I called Interpreter services and spoke with Ivin Booty. She will schedule him with patient for that date and time. I asked her to schedule him for 2 hours. Once that scheduling is confirmed she call us back to let us know.

## 2022-05-21 NOTE — Telephone Encounter (Signed)
Referral for neurosurgery fax to [facility]. Phone: [phone], Fax: [phone] 

## 2022-05-21 NOTE — Patient Instructions (Signed)
Needs to see neurosurgery, will call with appointment

## 2022-05-21 NOTE — Telephone Encounter (Signed)
Pod 4: I scheduled a neurosurgery appointment for patient for his cervrical myelopathy and cord signal with Dr. Reatha Armour Feb 7th at 11am. The interpreter is a good friend of patient's and said he would relay the message to patient and go with patient as interpreter, we had patient sign a new HIPAA with interpreter on there so we can share information. 2 things need to happen:   Call [place] who is the interpreter who will inform patient and go to the neurosurgery appointment with patient.  ensure Feb 7th 11am is good for him. Cell phone [phone]  Call interpreter's office and request his services professionally/officially to go with Mr. Joni Fears to the appointment [phone] If this is not a good time I can call and fine another appointment with Dr. Reatha Armour. Thanks. Fyi dr Reatha Armour patient has high-level (I think c3-c4) cervical myelopathy and cord signal as well as myelopathic signs on exam I am sure he will need surgery thanks

## 2022-05-21 NOTE — Telephone Encounter (Signed)
I was told by our phone staff that Ivin Booty called back and confirmed pt will have interpreter on 2/7 at 10:45 AM.

## 2022-05-21 NOTE — Progress Notes (Signed)
[facility]    Provider:  Dr Jaynee Eagles Requesting Provider: Trey Sailors, PA Primary Care Provider:  Trey Sailors, [place]  CC:  Bilateral leg pain, walking problems  05/21/2022: Reviewed images with patient and interpreter. He has severe higher-level cervical stenosis with myelopathy and mod to severe at other levels. Discussed he needs to see neurosurgery. He has myelopathic signs on exam (brisk reflexes, ataxia). Discussed Neurosurgery and decompression. Discussed seeing neurosurgery. His interpreter is his best friend we will see if we can go through his interpreter to make appointment. [place] who is the interpreter and will go to the neurosurgery appointment. Cell phone [phone] of [phone] work interpreter, patient is flexible, we will make neurosurgery appointment through interpreter.   Addendum: MRi cervical spine showed compressive myelopathy sent to [facility]. See below. Sent to [facility] 01/17/2022. Reviewed images with patient, agree with findings  Patient complains of symptoms per HPI as well as the following symptoms: weakness . Pertinent negatives and positives per HPI. All others negative   Addendum 12/31/2021: Patient is prediabetic, unfortunately this can be a cause for neuropathy in the feet even though it is just pre-diabetes. I don;t see any other causes. His B6 is a little low, we can try a prescription medication with B6 in it foltx if he would like to try it. I don;t see any other cause other than his back issues. Still awaiting repeat mri c-spine and l-spine. . Thanks  MRI c-spine: IMPRESSION: This MRI of the cervical spine without contrast shows the following: Hyperintense signal within the central gray matter of the spinal cord, right much more than left, adjacent to C4 consistent with compressive myelopathy.  This is probably unchanged compared to the 12/29/2019 MRI although the signal within the left hemicord is a little more apparent. Moderate  spinal stenosis at C3-C4 and C4-C5 and C5-C6 and mild spinal stenosis at C6-C7 is unchanged compared to the 29 Aug 2019 MRI. Severe bilateral foraminal narrowing at C3-C4 with potential for C4 nerve root compression to either side, moderately severe bilateral foraminal narrowing at C4-C5 with potential for bilateral C5 nerve root compression and moderately severe left foraminal narrowing at C5-C6 with potential for left C6 nerve root compression and moderately severe right foraminal narrowing at C6-C7 with potential for right C7 nerve root compression.  These are essentially unchanged compared to the 2021 MRI.      MRI L-Spine:  IMPRESSION: This MRI of the lumbar spine without contrast shows the following: Mild degenerative changes at T12-L1 and L1-L2 not leading to spinal stenosis or nerve root compression. At L2-L3, there is mild spinal stenosis and moderate right foraminal narrowing and bilateral lateral recess stenosis.  There does not appear to be any nerve root compression At L3-L4, there is mild to moderate spinal stenosis and moderately severe left lateral recess stenosis and moderate right lateral recess stenosis and mild to moderate bilateral foraminal narrowing.  There is potential for left L4 nerve root compression. At L4-L5 and L5-S1, there is posterior decompression and PLIF.  There is no spinal stenosis or nerve root compression at these levels Degenerative changes are stable compared to the MRI from 12/29/2019.  12/31/2021: Patient seen in the past for leg pain and burning in the feet and leg pain after surgery for decompression of very severe spinal stenosis 2018 . Discussed findings of emg/ncs in 2021: Chronic changes L5/s1. Also mild sensory polyneuropathy.(Addendum: HgbA1c 6.0, prediabetes which can cause sensory neuropathy, follow up with primary care). Patient with  residual leg pain after surgery for decompression of very severe spinal stenosis 2018 L4/L5. Today back for same.  Interpreter on the phone, Interpreter is SNOW. Also here with father in law.No fhx of autoimmune disease or hereditary nerve disease.   Patient seen in the past for leg pain and burning in the feet and leg pain after surgery for decompression of very severe spinal stenosis 2018. Symptoms are in the feet, still burning in the feet on the soles of the feet and numbness. Getting worse, slowly worse. Feels it the most at night time in bed about 7 pm until 12-1am then feels better. Started after the back surgery. Left foot is worse but right is included as well. He has some weakness in his leg and he is not sure when he had surgery,  gait abnormality, also weakness of the arm with signiciant changes in the c-spine with myelopathy on mri c-spine 2021 and he is unclear of cervical surgery after or before 2021 MRi c-spine showing significant changes. Left arm is weak. No other focal neurologic deficits, associated symptoms, inciting events or modifiable factors.  Personally reviewed images and agree.   No neck pain or radiculopathy, but feels weakness in the left arm.  IMPRESSION: 2021 : had surgery to correct? 1. Advanced multilevel degenerative changes of the cervical spine as described above. Moderate spinal canal stenosis at C3-C4 and C4-C5. Severe neuroforaminal stenosis from C3-C4 through C6-C7. 2. Focal increased signal in the right hemicord at C4, likely representing myelomalacia.  MRI lumbar spine: CLINICAL DATA:  Chronic bilateral leg pain and weakness, not improved after back surgery in 2018. 12/29/2019   EXAM: MRI LUMBAR SPINE WITHOUT CONTRAST   TECHNIQUE: Multiplanar, multisequence MR imaging of the lumbar spine was performed. No intravenous contrast was administered.   COMPARISON:  CT lumbar spine dated December 20, 2016.   FINDINGS: Segmentation:  Standard.   Alignment: Unchanged trace retrolisthesis from L1-L2 through L3-L4. Unchanged mild anterolisthesis at L4-L5.   Vertebrae:   No fracture, evidence of discitis, or bone lesion.   Conus medullaris and cauda equina: Conus extends to the L1 level. Conus and cauda equina appear normal.   Paraspinal and other soft tissues: Negative.   Disc levels:   Congenitally short pedicles.   T12-L1: No significant disc bulge or herniation. Mild bilateral facet arthropathy. No stenosis.   L1-L2: Mild disc bulging and bilateral facet arthropathy. Mild bilateral neuroforaminal stenosis. No spinal canal stenosis.   L2-L3: Mild disc bulging and bilateral facet arthropathy. Mild spinal canal and bilateral neuroforaminal stenosis.   L3-L4: Mild disc bulging and bilateral facet arthropathy. Moderate spinal canal stenosis. Mild to moderate bilateral neuroforaminal stenosis.   L4-L5: Interval posterior decompression and PLIF. No residual stenosis.   L5-S1: Interval posterior decompression and PLIF. No residual stenosis.   IMPRESSION: 1. Interval L4-S1 posterior decompression and PLIF without residual stenosis. 2. Multilevel lumbar spondylosis as described above, worst at L3-L4 where there is moderate spinal canal stenosis. Findings are similar to prior CT.    HPI:  Steven Ross is a 67 y.o. male here as requested by Trey Sailors, PA for evaluation for EMG nerve conduction study by spine and scoliosis specialists.  Patient has low back pain, severity 8 out of 10, stable, occurs persistently, lower back, knees and lower legs, symptoms are aggravated by walking, no relieving factors, he has a past medical history of chronic joint pain, abnormal gait, lumbar spine ankylosis, lumbar fusion, he is on high blood pressure medication.  He  complains of pain, numbness, tingling, burning in the bilateral lower extremities from the knees to the bottom of the feet, no pain in thighs or neck, onset was in 2015, worsens with prolonged standing and walking but sitting does not alleviate the pain at all, using a cane due to instability while  walking, history L4-S1 by Dr. Cyndy Freeze in 2015 due to very severe L4-L5 central canal stenosis (see MRI 2018 on [facility]), patient reports that all his symptoms were present preop, were unchanged postop, and continue to gradually worsen, he fell out of a tree in 1986 with injury to the back at that time.  He is here with family and an interpreter Humberto Leep. After surgery he felt better, now he has pain from the lateral side of the knee to the foot. This was there before surgery and never improved. His back is a lot better. Only right leg. He can hardly stand up. The left leg a little bit mostly the right leg, he can't wiggle his toes and burns and numbness. From the lateral knee to the foot. Gabapentin doesn't help. Hurts worse when he walks. He has weakness of dorsiflexion right foot. No other focal neurologic deficits, associated symptoms, inciting events or modifiable factors.  Patient complains of symptoms per HPI as well as the following symptoms: leg and left arm weakness . Pertinent negatives and positives per HPI. All others negative  Reviewed notes, labs and imaging from outside physicians, which showed:  MRI lumbar spine 12/04/2016:  Disc levels:  L1-2: Small broad-based disc bulge without neural impingement. Minimal hypertrophy of the facet joints.  L2-3: Minimal disc bulging into the neural foramina without neural impingement. Slight hypertrophy of the facet joints and ligamentum flavum.  L3-4: Slight disc bulging into the neural foramina without neural impingement. Congenitally short pedicles with slight hypertrophy of the ligamentum flavum and facet joints combine to create mild spinal stenosis.  L4-5: Almost complete obliteration of the thecal sac due to marked hypertrophy of the ligamentum flavum and facet joints and congenitally short pedicles as well as a 2 mm spondylolisthesis and a broad-based bulge of the uncovered disc with small disc protrusions into both neural foramina.  This has progressed since the prior study of 2015.  L5-S1: Minimal broad-based disc bulge asymmetric into the left neural foramen. Slight hypertrophy of the ligamentum flavum facet joints with narrowing of the left lateral recess which could affect the left S1 nerve. Moderate spinal stenosis. Congenitally short pedicles.  IMPRESSION: personally reviewed images and agree 1. Very severe spinal stenosis at L4-5 with almost complete obliteration of the thecal sac, primarily due to posterior element hypertrophy and congenitally short pedicles. 2. Moderate spinal stenosis at L5-S1. 3. Mild spinal stenosis at L3-4   MRI 2021:  Disc levels:   Congenitally short pedicles.   T12-L1: No significant disc bulge or herniation. Mild bilateral facet arthropathy. No stenosis.   L1-L2: Mild disc bulging and bilateral facet arthropathy. Mild bilateral neuroforaminal stenosis. No spinal canal stenosis.   L2-L3: Mild disc bulging and bilateral facet arthropathy. Mild spinal canal and bilateral neuroforaminal stenosis.   L3-L4: Mild disc bulging and bilateral facet arthropathy. Moderate spinal canal stenosis. Mild to moderate bilateral neuroforaminal stenosis.   L4-L5: Interval posterior decompression and PLIF. No residual stenosis.   L5-S1: Interval posterior decompression and PLIF. No residual stenosis.   IMPRESSION: 1. Interval L4-S1 posterior decompression and PLIF without residual stenosis. 2. Multilevel lumbar spondylosis as described above, worst at L3-L4 where there is moderate spinal canal  stenosis. Findings are similar to prior CT.  Review of Systems: Patient complains of symptoms per HPI as well as the following symptoms: leg pain. Pertinent negatives and positives per HPI. All others negative.   Social History   Socioeconomic History   Marital status: Married    Spouse name: Not on file   Number of children: Not on file   Years of education: Not on file   Highest  education level: Not on file  Occupational History   Not on file  Tobacco Use   Smoking status: Former    Types: Cigarettes   Smokeless tobacco: Never   Tobacco comments:    quit smoking cigarettes in 2012  Vaping Use   Vaping Use: Never used  Substance and Sexual Activity   Alcohol use: No   Drug use: No   Sexual activity: Yes    Birth control/protection: None    Comment: last intercourse 3 weeks ago  Other Topics Concern   Not on file  Social History Narrative   Patient lives at home with his wife and daughter.   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 [facility]) 05/21/2022   Spondylolisthesis of lumbosacral region 12/27/2016    Past Surgical History:  Procedure Laterality Date   ABSCESS DRAINAGE     ANTERIOR LAT LUMBAR FUSION N/A 12/27/2016   Procedure: Lumbar four-five Transpsoas lumbar interbody fusion with Lumbar five-Sacrum one  Posterior lumbar interbody fusion, Lumbar four-five  Gill procedure, Lumbar five-Sacrum one Laminectomy for decompression, Lumbar four-Sacrum one cortical trajectory screw fixation and fusion with Mazor;  Surgeon: Ditty, Kevan Ny, MD;  Location: [place];  Service: Neurosurgery;  Laterality: N/A;   APPLICATION OF ROBOTIC ASSISTANCE FOR SPINAL PROCEDURE N/A 12/27/2016   Procedure: APPLICATION OF ROBOTIC ASSISTANCE FOR SPINAL  PROCEDURE;  Surgeon: Ditty, Kevan Ny, MD;  Location: [place];  Service: Neurosurgery;  Laterality: N/A;    Current Outpatient Medications  Medication Sig Dispense Refill   FOLBEE 2.5-25-1 MG TABS tablet TAKE 1 TABLET BY MOUTH EVERY DAY 30 tablet 3   ibuprofen (ADVIL) 800 MG tablet 1 tab(s)     lisinopril (ZESTRIL) 20 MG tablet Take 20 mg by mouth daily.     gabapentin (NEURONTIN) 300 MG capsule Start with one pill 3x a day. And if it doesn't help increae to 2 pills 3x a day. (Patient not taking: Reported on 05/21/2022) 180 capsule 11   No current facility-administered medications for this visit.    Allergies as of 05/21/2022   (No Known Allergies)    Vitals: BP (!) 166/88   Pulse 78   Ht '5\' 4"'$  (1.626 m)   Wt 204 lb (92.5 kg)   BMI 35.02 kg/m  Last Weight:  Wt Readings from Last 1 Encounters:  05/21/22 204 lb (92.5 kg)  Last Height:   Ht Readings from Last 1 Encounters:  05/21/22 '5\' 4"'$  (1.626 m)   Exam: NAD, pleasant                  Speech:    Speech is normal; fluent and spontaneous with normal comprehension.  Cognition:    The patient is oriented to person, place, and time;     recent and remote memory intact;     language fluent;    Cranial Nerves:    The pupils are equal, round, and reactive to light.Trigeminal sensation is intact and the muscles of mastication are normal. The face is symmetric. The palate elevates in the midline. Hearing intact. Voice is normal. Shoulder shrug is normal. The tongue has normal motion without fasciculations.   Coordination:  No dysmetria  Motor Observation:    No asymmetry, no atrophy, and no involuntary movements noted. Tone:    Normal muscle tone.     Strength:    Strength is V/V in the upper and lower limbs.      Sensation: intact to LT, pin prick distally decreased in the toes, decreased temp in the toes in a gradient, absent proprioception in the toes, absent vibration to the medial malleoli  Reflexes, absent AJs,  otherwise left hyperreflexic, right 1+ otherwise  + left hoffman's sign   Gait: Ataxic, wide based  Assessment/Plan:  Patient with residual leg pain after surgery for decompression of very severe spinal stenosis 2018 L4/L5. Also Ataxia, cervical stenosis with myelopathy. Worked up in the past for for leg pain and burning in the feet and leg pain after surgery for decompression of very severe spinal stenosis 2018 . Discussed findings of emg/ncs in 2021: Chronic changes L5/s1. Also mild sensory polyneuropathy.(HgbA1c 6.0, prediabetes which can cause sensory neuropathy. Patient here again today still with residual leg pain after surgery for decompression of very severe spinal stenosis 2018 L4/L5.  Reviewed images with patient and interpreter. He has severe higher-level cervical stenosis with myelopathy and mod to severe at other levels. Discussed he needs to see neurosurgery. He has myelopathic signs on exam (brisk reflexes, ataxia, hoffman's sign). Discussed Neurosurgery and decompression. Discussed seeing neurosurgery. His interpreter is his best friend we will see if we can go through his interpreter to make appointment. [place] who is the interpreter and will go to the neurosurgery appointment. Cell phone [phone] of [phone] work interpreter, patient is flexible, we will make neurosurgery appointment through interpreter.   - called Neurosurgery appt with Dr. Reatha Armour 2/7 will call interpreter and make sure we call interpreter's company for his professional help  - He has  gait abnormality, also weakness of the arm with signiciant changes in the c-spine with myelopathy on mri c-spine 2021 and he is unclear of cervical surgery after or before 2021 MRi c-spine showing significant changes. Left arm is weak. Repeat mri cervical spine shows compressive myelopathy with cord signal changes  Blood work today for other causes of numbness in feet such as diabetic neuropathy at last  appointment Gabapentin for pain in the feet: try lyrica next for pain  Orders Placed This Encounter  Procedures   Ambulatory referral to Neurosurgery     Cc: Trey Sailors, PA,  Trey Sailors, [place]  Sarina Ill, MD  [facility] [address] [place], [place] [address]  Phone [phone] Fax [phone]  I spent over 70 minutes of face-to-face and non-face-to-face time with patient on the  1. Myelopathy concurrent with  and due to spinal stenosis of cervical region [facility])     diagnosis.  This included previsit chart review, lab review, study review, order entry, electronic health record documentation, patient education on the different diagnostic and therapeutic options, counseling and coordination of care, risks and benefits of management, compliance, or risk factor reduction

## 2022-05-23 NOTE — Telephone Encounter (Signed)
Thank you ! I think Feb 7th they see you. Ty again!!!

## 2022-05-31 DIAGNOSIS — K219 Gastro-esophageal reflux disease without esophagitis: Secondary | ICD-10-CM | POA: Diagnosis not present

## 2022-05-31 DIAGNOSIS — E669 Obesity, unspecified: Secondary | ICD-10-CM | POA: Diagnosis not present

## 2022-05-31 DIAGNOSIS — R7303 Prediabetes: Secondary | ICD-10-CM | POA: Diagnosis not present

## 2022-05-31 DIAGNOSIS — I10 Essential (primary) hypertension: Secondary | ICD-10-CM | POA: Diagnosis not present

## 2022-05-31 DIAGNOSIS — E782 Mixed hyperlipidemia: Secondary | ICD-10-CM | POA: Diagnosis not present

## 2022-05-31 DIAGNOSIS — G629 Polyneuropathy, unspecified: Secondary | ICD-10-CM | POA: Diagnosis not present

## 2022-09-05 ENCOUNTER — Encounter: Payer: Self-pay | Admitting: Neurology

## 2022-10-16 DIAGNOSIS — I10 Essential (primary) hypertension: Secondary | ICD-10-CM | POA: Diagnosis not present

## 2022-10-16 DIAGNOSIS — K219 Gastro-esophageal reflux disease without esophagitis: Secondary | ICD-10-CM | POA: Diagnosis not present

## 2022-10-16 DIAGNOSIS — R7303 Prediabetes: Secondary | ICD-10-CM | POA: Diagnosis not present

## 2022-10-16 DIAGNOSIS — Z125 Encounter for screening for malignant neoplasm of prostate: Secondary | ICD-10-CM | POA: Diagnosis not present

## 2022-10-16 DIAGNOSIS — G629 Polyneuropathy, unspecified: Secondary | ICD-10-CM | POA: Diagnosis not present

## 2022-10-16 DIAGNOSIS — E669 Obesity, unspecified: Secondary | ICD-10-CM | POA: Diagnosis not present

## 2022-10-16 DIAGNOSIS — E782 Mixed hyperlipidemia: Secondary | ICD-10-CM | POA: Diagnosis not present

## 2022-10-16 DIAGNOSIS — Z0001 Encounter for general adult medical examination with abnormal findings: Secondary | ICD-10-CM | POA: Diagnosis not present

## 2022-11-20 ENCOUNTER — Ambulatory Visit: Payer: Medicare Other | Admitting: Neurology

## 2023-01-16 DIAGNOSIS — K219 Gastro-esophageal reflux disease without esophagitis: Secondary | ICD-10-CM | POA: Diagnosis not present

## 2023-01-16 DIAGNOSIS — G629 Polyneuropathy, unspecified: Secondary | ICD-10-CM | POA: Diagnosis not present

## 2023-01-16 DIAGNOSIS — E782 Mixed hyperlipidemia: Secondary | ICD-10-CM | POA: Diagnosis not present

## 2023-01-16 DIAGNOSIS — E669 Obesity, unspecified: Secondary | ICD-10-CM | POA: Diagnosis not present

## 2023-01-16 DIAGNOSIS — R7303 Prediabetes: Secondary | ICD-10-CM | POA: Diagnosis not present

## 2023-01-16 DIAGNOSIS — I10 Essential (primary) hypertension: Secondary | ICD-10-CM | POA: Diagnosis not present

## 2023-04-17 DIAGNOSIS — I10 Essential (primary) hypertension: Secondary | ICD-10-CM | POA: Diagnosis not present

## 2023-04-17 DIAGNOSIS — M4180 Other forms of scoliosis, site unspecified: Secondary | ICD-10-CM | POA: Diagnosis not present

## 2023-04-17 DIAGNOSIS — M549 Dorsalgia, unspecified: Secondary | ICD-10-CM | POA: Diagnosis not present

## 2023-04-17 DIAGNOSIS — E782 Mixed hyperlipidemia: Secondary | ICD-10-CM | POA: Diagnosis not present

## 2023-04-17 DIAGNOSIS — M479 Spondylosis, unspecified: Secondary | ICD-10-CM | POA: Diagnosis not present

## 2023-04-17 DIAGNOSIS — K219 Gastro-esophageal reflux disease without esophagitis: Secondary | ICD-10-CM | POA: Diagnosis not present

## 2023-04-17 DIAGNOSIS — E669 Obesity, unspecified: Secondary | ICD-10-CM | POA: Diagnosis not present

## 2023-04-17 DIAGNOSIS — R7303 Prediabetes: Secondary | ICD-10-CM | POA: Diagnosis not present

## 2023-04-17 DIAGNOSIS — G629 Polyneuropathy, unspecified: Secondary | ICD-10-CM | POA: Diagnosis not present

## 2023-07-17 DIAGNOSIS — R7303 Prediabetes: Secondary | ICD-10-CM | POA: Diagnosis not present

## 2023-07-17 DIAGNOSIS — M479 Spondylosis, unspecified: Secondary | ICD-10-CM | POA: Diagnosis not present

## 2023-07-17 DIAGNOSIS — I10 Essential (primary) hypertension: Secondary | ICD-10-CM | POA: Diagnosis not present

## 2023-07-17 DIAGNOSIS — M549 Dorsalgia, unspecified: Secondary | ICD-10-CM | POA: Diagnosis not present

## 2023-07-17 DIAGNOSIS — E782 Mixed hyperlipidemia: Secondary | ICD-10-CM | POA: Diagnosis not present

## 2023-07-17 DIAGNOSIS — K219 Gastro-esophageal reflux disease without esophagitis: Secondary | ICD-10-CM | POA: Diagnosis not present

## 2023-07-17 DIAGNOSIS — G629 Polyneuropathy, unspecified: Secondary | ICD-10-CM | POA: Diagnosis not present

## 2023-07-17 DIAGNOSIS — E669 Obesity, unspecified: Secondary | ICD-10-CM | POA: Diagnosis not present

## 2023-10-17 DIAGNOSIS — G629 Polyneuropathy, unspecified: Secondary | ICD-10-CM | POA: Diagnosis not present

## 2023-10-17 DIAGNOSIS — I10 Essential (primary) hypertension: Secondary | ICD-10-CM | POA: Diagnosis not present

## 2023-10-17 DIAGNOSIS — Z0000 Encounter for general adult medical examination without abnormal findings: Secondary | ICD-10-CM | POA: Diagnosis not present

## 2023-10-17 DIAGNOSIS — E782 Mixed hyperlipidemia: Secondary | ICD-10-CM | POA: Diagnosis not present

## 2023-10-17 DIAGNOSIS — M479 Spondylosis, unspecified: Secondary | ICD-10-CM | POA: Diagnosis not present

## 2023-10-17 DIAGNOSIS — E669 Obesity, unspecified: Secondary | ICD-10-CM | POA: Diagnosis not present

## 2023-10-17 DIAGNOSIS — R7303 Prediabetes: Secondary | ICD-10-CM | POA: Diagnosis not present

## 2023-10-17 DIAGNOSIS — K219 Gastro-esophageal reflux disease without esophagitis: Secondary | ICD-10-CM | POA: Diagnosis not present

## 2023-10-17 DIAGNOSIS — Z125 Encounter for screening for malignant neoplasm of prostate: Secondary | ICD-10-CM | POA: Diagnosis not present

## 2023-12-10 DIAGNOSIS — Z09 Encounter for follow-up examination after completed treatment for conditions other than malignant neoplasm: Secondary | ICD-10-CM | POA: Diagnosis not present

## 2023-12-10 DIAGNOSIS — D124 Benign neoplasm of descending colon: Secondary | ICD-10-CM | POA: Diagnosis not present

## 2023-12-10 DIAGNOSIS — Z860101 Personal history of adenomatous and serrated colon polyps: Secondary | ICD-10-CM | POA: Diagnosis not present

## 2023-12-12 DIAGNOSIS — D124 Benign neoplasm of descending colon: Secondary | ICD-10-CM | POA: Diagnosis not present

## 2024-01-03 DIAGNOSIS — H6123 Impacted cerumen, bilateral: Secondary | ICD-10-CM | POA: Diagnosis not present

## 2024-01-16 DIAGNOSIS — R7303 Prediabetes: Secondary | ICD-10-CM | POA: Diagnosis not present

## 2024-01-16 DIAGNOSIS — M479 Spondylosis, unspecified: Secondary | ICD-10-CM | POA: Diagnosis not present

## 2024-01-16 DIAGNOSIS — G629 Polyneuropathy, unspecified: Secondary | ICD-10-CM | POA: Diagnosis not present

## 2024-01-16 DIAGNOSIS — Z0001 Encounter for general adult medical examination with abnormal findings: Secondary | ICD-10-CM | POA: Diagnosis not present

## 2024-01-16 DIAGNOSIS — I10 Essential (primary) hypertension: Secondary | ICD-10-CM | POA: Diagnosis not present

## 2024-01-16 DIAGNOSIS — K219 Gastro-esophageal reflux disease without esophagitis: Secondary | ICD-10-CM | POA: Diagnosis not present

## 2024-01-16 DIAGNOSIS — E669 Obesity, unspecified: Secondary | ICD-10-CM | POA: Diagnosis not present

## 2024-01-16 DIAGNOSIS — E782 Mixed hyperlipidemia: Secondary | ICD-10-CM | POA: Diagnosis not present

## 2024-01-16 DIAGNOSIS — M25512 Pain in left shoulder: Secondary | ICD-10-CM | POA: Diagnosis not present
